# Patient Record
Sex: Male | Born: 1960
Health system: Southern US, Community
[De-identification: ages and names within clinical notes are randomized; demographics above are authoritative.]

## PROBLEM LIST (undated history)

## (undated) DIAGNOSIS — E119 Type 2 diabetes mellitus without complications: Secondary | ICD-10-CM

## (undated) DIAGNOSIS — Z955 Presence of coronary angioplasty implant and graft: Secondary | ICD-10-CM

## (undated) DIAGNOSIS — I10 Essential (primary) hypertension: Secondary | ICD-10-CM

## (undated) DIAGNOSIS — F429 Obsessive-compulsive disorder, unspecified: Secondary | ICD-10-CM

## (undated) DIAGNOSIS — I25110 Atherosclerotic heart disease of native coronary artery with unstable angina pectoris: Secondary | ICD-10-CM

## (undated) DIAGNOSIS — I2510 Atherosclerotic heart disease of native coronary artery without angina pectoris: Secondary | ICD-10-CM

## (undated) DIAGNOSIS — E785 Hyperlipidemia, unspecified: Secondary | ICD-10-CM

## (undated) DIAGNOSIS — R7303 Prediabetes: Secondary | ICD-10-CM

## (undated) DIAGNOSIS — T7422XA Child sexual abuse, confirmed, initial encounter: Secondary | ICD-10-CM

## (undated) DIAGNOSIS — I2121 ST elevation (STEMI) myocardial infarction involving left circumflex coronary artery: Secondary | ICD-10-CM

## (undated) DIAGNOSIS — N289 Disorder of kidney and ureter, unspecified: Secondary | ICD-10-CM

## (undated) HISTORY — DX: Obsessive-compulsive disorder, unspecified: F42.9

## (undated) HISTORY — DX: Child sexual abuse, confirmed, initial encounter: T74.22XA

## (undated) HISTORY — DX: Hyperlipidemia, unspecified: E78.5

## (undated) HISTORY — PX: PANCREAS SURGERY: SHX731

---

## 1999-01-09 ENCOUNTER — Encounter: Payer: Self-pay | Admitting: Emergency Medicine

## 1999-01-09 ENCOUNTER — Emergency Department (HOSPITAL_COMMUNITY): Admission: EM | Admit: 1999-01-09 | Discharge: 1999-01-09 | Payer: Self-pay | Admitting: Emergency Medicine

## 1999-01-15 ENCOUNTER — Ambulatory Visit (HOSPITAL_COMMUNITY): Admission: RE | Admit: 1999-01-15 | Discharge: 1999-01-15 | Payer: Self-pay | Admitting: Pediatrics

## 1999-01-15 ENCOUNTER — Encounter: Payer: Self-pay | Admitting: *Deleted

## 1999-01-20 ENCOUNTER — Encounter: Payer: Self-pay | Admitting: *Deleted

## 1999-01-20 ENCOUNTER — Ambulatory Visit (HOSPITAL_COMMUNITY): Admission: RE | Admit: 1999-01-20 | Discharge: 1999-01-20 | Payer: Self-pay | Admitting: Pediatrics

## 1999-05-31 ENCOUNTER — Encounter: Payer: Self-pay | Admitting: Emergency Medicine

## 1999-05-31 ENCOUNTER — Emergency Department (HOSPITAL_COMMUNITY): Admission: EM | Admit: 1999-05-31 | Discharge: 1999-06-01 | Payer: Self-pay | Admitting: Emergency Medicine

## 1999-06-01 ENCOUNTER — Encounter: Payer: Self-pay | Admitting: Emergency Medicine

## 2001-08-24 ENCOUNTER — Emergency Department (HOSPITAL_COMMUNITY): Admission: EM | Admit: 2001-08-24 | Discharge: 2001-08-25 | Payer: Self-pay | Admitting: Emergency Medicine

## 2005-03-17 ENCOUNTER — Ambulatory Visit (HOSPITAL_COMMUNITY): Admission: RE | Admit: 2005-03-17 | Discharge: 2005-03-17 | Payer: Self-pay | Admitting: Family Medicine

## 2007-05-05 ENCOUNTER — Ambulatory Visit (HOSPITAL_COMMUNITY): Admission: RE | Admit: 2007-05-05 | Discharge: 2007-05-05 | Payer: Self-pay | Admitting: Family Medicine

## 2007-06-08 ENCOUNTER — Ambulatory Visit (HOSPITAL_COMMUNITY): Admission: RE | Admit: 2007-06-08 | Discharge: 2007-06-08 | Payer: Self-pay | Admitting: Family Medicine

## 2009-01-20 ENCOUNTER — Emergency Department (HOSPITAL_COMMUNITY): Admission: EM | Admit: 2009-01-20 | Discharge: 2009-01-20 | Payer: Self-pay | Admitting: Emergency Medicine

## 2009-01-21 ENCOUNTER — Ambulatory Visit (HOSPITAL_COMMUNITY): Admission: RE | Admit: 2009-01-21 | Discharge: 2009-01-21 | Payer: Self-pay | Admitting: Emergency Medicine

## 2010-04-27 LAB — BASIC METABOLIC PANEL
BUN: 13 mg/dL (ref 6–23)
Calcium: 9.7 mg/dL (ref 8.4–10.5)
GFR calc non Af Amer: >60 mL/min (ref >60)
Glucose, Bld: 189 mg/dL — ABNORMAL HIGH (ref 70–99)

## 2012-04-27 ENCOUNTER — Encounter (HOSPITAL_COMMUNITY): Payer: Self-pay | Admitting: Psychiatry

## 2012-04-27 ENCOUNTER — Ambulatory Visit (INDEPENDENT_AMBULATORY_CARE_PROVIDER_SITE_OTHER): Payer: PRIVATE HEALTH INSURANCE | Admitting: Psychiatry

## 2012-04-27 VITALS — Wt 234.8 lb

## 2012-04-27 DIAGNOSIS — F489 Nonpsychotic mental disorder, unspecified: Secondary | ICD-10-CM

## 2012-04-27 DIAGNOSIS — F332 Major depressive disorder, recurrent severe without psychotic features: Secondary | ICD-10-CM | POA: Insufficient documentation

## 2012-04-27 DIAGNOSIS — F418 Other specified anxiety disorders: Secondary | ICD-10-CM

## 2012-04-27 DIAGNOSIS — F429 Obsessive-compulsive disorder, unspecified: Secondary | ICD-10-CM | POA: Insufficient documentation

## 2012-04-27 MED ORDER — CITALOPRAM HYDROBROMIDE 20 MG PO TABS: 20.0000 mg | ORAL_TABLET | Freq: Every day | ORAL | Status: DC

## 2012-04-27 MED ORDER — CLONIDINE HCL 0.1 MG PO TABS: ORAL_TABLET | ORAL | Status: DC
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
## 2012-04-27 NOTE — Patient Instructions (Signed)
Relaxation is the ultimate solution for you.  You can seek it through tub baths, bubble baths, essential oils or incense, walking or chatting with friends, listening to soft music, watching a candle burn and just letting all thoughts go and appreciating the true essence of the Creator.  Pets or animals may be very helpful.  You might spend some time with them and then go do more directed meditation.  Yoga is a very helpful exercise method.  On TV, on line, or by DVD Austin Mcknight is a source of high quality information about yoga and videos on yoga.  Austin Mcknight is the world's number one video yoga instructor according to some experts.  There are exceptional health benefits that can be achieved through yoga.  The main principles of yoga is acceptance, no competition, no comparison, and no judgement.  It is exceptional in helping people meditate and get to a very relaxed state.   Get caught up with sleep in the weekends  If the Clonidine doesn't last long enough, then call me back to change to some thing that lasts longer.  Call if problems or concerns.

## 2012-04-28 ENCOUNTER — Encounter (HOSPITAL_COMMUNITY): Payer: Self-pay | Admitting: Psychiatry

## 2012-05-26 ENCOUNTER — Ambulatory Visit (HOSPITAL_COMMUNITY): Payer: Self-pay | Admitting: Psychiatry

## 2012-07-14 ENCOUNTER — Ambulatory Visit (INDEPENDENT_AMBULATORY_CARE_PROVIDER_SITE_OTHER): Payer: PRIVATE HEALTH INSURANCE | Admitting: Psychiatry

## 2012-07-14 ENCOUNTER — Encounter (HOSPITAL_COMMUNITY): Payer: Self-pay | Admitting: Psychiatry

## 2012-07-14 ENCOUNTER — Telehealth (HOSPITAL_COMMUNITY): Payer: Self-pay | Admitting: Psychiatry

## 2012-07-14 VITALS — BP 120/84 | Ht 70.25 in | Wt 228.4 lb

## 2012-07-14 DIAGNOSIS — F5105 Insomnia due to other mental disorder: Secondary | ICD-10-CM

## 2012-07-14 DIAGNOSIS — F429 Obsessive-compulsive disorder, unspecified: Secondary | ICD-10-CM

## 2012-07-14 DIAGNOSIS — F332 Major depressive disorder, recurrent severe without psychotic features: Secondary | ICD-10-CM

## 2012-07-14 DIAGNOSIS — R6882 Decreased libido: Secondary | ICD-10-CM | POA: Insufficient documentation

## 2012-07-14 MED ORDER — CITALOPRAM HYDROBROMIDE 20 MG PO TABS: 30.0000 mg | ORAL_TABLET | Freq: Every day | ORAL | Status: DC

## 2012-07-14 MED ORDER — CLONIDINE HCL 0.1 MG PO TABS: ORAL_TABLET | ORAL | Status: DC

## 2012-07-14 MED ORDER — CYPROHEPTADINE HCL 4 MG PO TABS: 4.0000 mg | ORAL_TABLET | ORAL | Status: DC | PRN
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
## 2012-07-14 NOTE — Telephone Encounter (Signed)
Defined for pharmacy what the spice of life ment.  Resent Clonidine with shortened instructions for eScript transmission.

## 2012-07-14 NOTE — Patient Instructions (Addendum)
Set a timer for 8 or a certain number minutes and walk for that amount of time in the house or in the yard.  Mark the number of minutes on a calendar for that day.  Do that every day this week.  Then next week increase the time by 1 minutes and then mark the calendar with the number of minutes for that day.  Each week increase your exercise by one minute.  Keep a record of this so you can see the progress you are making.  Do this every day, just like eating and sleeping.  It is good for pain control, depression, and for your soul/spirit.  Bring the record in for your next visit so we can talk about your effort and how you feel with the new exercise program going and working for you.  Relaxation is the ultimate solution for you.  You can seek it through tub baths, bubble baths, essential oils or incense, walking or chatting with friends, listening to soft music, watching a candle burn and just letting all thoughts go and appreciating the true essence of the Creator.  Pets or animals may be very helpful.  You might spend some time with them and then go do more directed meditation.  "I am Wishes Fulfilled Meditation" by Marylene Buerger and Lyndal Pulley may be helpful MUSIC for getting to sleep or for meditating You can order it from on line.  You might find the Chill channel on [email or web address] and explore the artists that you like better.   Schedule talking therapy appointment today.  Call if problems or concerns.

## 2012-07-20 LAB — VITAMIN D 1,25 DIHYDROXY: Vitamin D2 1, 25 (OH)2: <8 pg/mL

## 2012-07-21 ENCOUNTER — Telehealth (HOSPITAL_COMMUNITY): Payer: Self-pay | Admitting: Psychiatry

## 2012-07-21 NOTE — Telephone Encounter (Signed)
Left message by initials indicating lab numbers and directing pt to reports this to PCP for follow-up.

## 2012-08-25 ENCOUNTER — Ambulatory Visit (HOSPITAL_COMMUNITY): Payer: Self-pay | Admitting: Psychiatry

## 2012-09-07 ENCOUNTER — Encounter (HOSPITAL_COMMUNITY): Payer: Self-pay | Admitting: Psychiatry

## 2012-09-07 ENCOUNTER — Ambulatory Visit (HOSPITAL_COMMUNITY): Payer: Self-pay | Admitting: Psychiatry

## 2012-09-07 ENCOUNTER — Ambulatory Visit (INDEPENDENT_AMBULATORY_CARE_PROVIDER_SITE_OTHER): Payer: PRIVATE HEALTH INSURANCE | Admitting: Psychiatry

## 2012-09-07 VITALS — BP 140/90 | Ht 70.0 in | Wt 237.0 lb

## 2012-09-07 DIAGNOSIS — F332 Major depressive disorder, recurrent severe without psychotic features: Secondary | ICD-10-CM

## 2012-09-07 DIAGNOSIS — F489 Nonpsychotic mental disorder, unspecified: Secondary | ICD-10-CM

## 2012-09-07 DIAGNOSIS — F4323 Adjustment disorder with mixed anxiety and depressed mood: Secondary | ICD-10-CM

## 2012-09-07 DIAGNOSIS — F429 Obsessive-compulsive disorder, unspecified: Secondary | ICD-10-CM

## 2012-09-07 MED ORDER — CLONAZEPAM 1 MG PO TABS: 1.0000 mg | ORAL_TABLET | Freq: Every evening | ORAL | Status: DC | PRN

## 2012-09-07 MED ORDER — DULOXETINE HCL 60 MG PO CPEP: 60.0000 mg | ORAL_CAPSULE | Freq: Every day | ORAL | Status: AC
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
## 2012-09-29 ENCOUNTER — Encounter (HOSPITAL_COMMUNITY): Payer: Self-pay | Admitting: Psychiatry

## 2012-09-29 ENCOUNTER — Ambulatory Visit (INDEPENDENT_AMBULATORY_CARE_PROVIDER_SITE_OTHER): Payer: PRIVATE HEALTH INSURANCE | Admitting: Psychiatry

## 2012-09-29 VITALS — Ht 70.0 in | Wt 231.0 lb

## 2012-09-29 DIAGNOSIS — F4323 Adjustment disorder with mixed anxiety and depressed mood: Secondary | ICD-10-CM
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
## 2012-11-29 ENCOUNTER — Ambulatory Visit (HOSPITAL_COMMUNITY): Payer: Self-pay | Admitting: Psychiatry

## 2013-01-05 ENCOUNTER — Ambulatory Visit (HOSPITAL_COMMUNITY): Payer: Self-pay | Admitting: Psychiatry

## 2013-04-04 ENCOUNTER — Encounter: Payer: Self-pay | Admitting: *Deleted

## 2013-04-11 ENCOUNTER — Ambulatory Visit (INDEPENDENT_AMBULATORY_CARE_PROVIDER_SITE_OTHER): Payer: BC Managed Care – PPO | Admitting: Endocrinology

## 2013-04-11 ENCOUNTER — Encounter: Payer: Self-pay | Admitting: Endocrinology

## 2013-04-11 VITALS — BP 114/82 | HR 75 | Temp 97.9°F | Ht 70.0 in | Wt 226.0 lb

## 2013-04-11 DIAGNOSIS — E039 Hypothyroidism, unspecified: Secondary | ICD-10-CM | POA: Insufficient documentation

## 2013-04-11 LAB — TSH: TSH: 5.527 u[IU]/mL — ABNORMAL HIGH (ref 0.350–4.500)

## 2013-04-11 LAB — T3, FREE: T3, Free: 3.4 pg/mL (ref 2.3–4.2)

## 2013-04-11 LAB — T4, FREE: FREE T4: 0.98 ng/dL (ref 0.80–1.80)

## 2013-04-11 NOTE — Patient Instructions (Signed)
blood tests are being requested for you today.  We'll contact you with results. Based on the results, i'll prescribe for you a thyroid hormone pill.
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
## 2014-11-25 ENCOUNTER — Ambulatory Visit: Payer: Self-pay | Admitting: Family Medicine

## 2014-12-26 ENCOUNTER — Ambulatory Visit: Payer: Self-pay | Admitting: Family Medicine

## 2015-01-23 ENCOUNTER — Ambulatory Visit: Payer: Self-pay | Admitting: Family Medicine

## 2015-02-18 ENCOUNTER — Ambulatory Visit (INDEPENDENT_AMBULATORY_CARE_PROVIDER_SITE_OTHER): Payer: No Typology Code available for payment source | Admitting: Family Medicine

## 2015-02-18 ENCOUNTER — Encounter: Payer: Self-pay | Admitting: Family Medicine

## 2015-02-18 ENCOUNTER — Other Ambulatory Visit: Payer: Self-pay | Admitting: Family Medicine

## 2015-02-18 VITALS — BP 142/94 | HR 80 | Temp 98.5°F | Resp 14 | Ht 69.0 in | Wt 246.0 lb

## 2015-02-18 DIAGNOSIS — E038 Other specified hypothyroidism: Secondary | ICD-10-CM

## 2015-02-18 DIAGNOSIS — Z7189 Other specified counseling: Secondary | ICD-10-CM | POA: Diagnosis not present

## 2015-02-18 DIAGNOSIS — Z0000 Encounter for general adult medical examination without abnormal findings: Secondary | ICD-10-CM | POA: Diagnosis not present

## 2015-02-18 DIAGNOSIS — Z7689 Persons encountering health services in other specified circumstances: Secondary | ICD-10-CM

## 2015-02-18 DIAGNOSIS — G4710 Hypersomnia, unspecified: Secondary | ICD-10-CM

## 2015-02-18 LAB — CBC WITH DIFFERENTIAL/PLATELET
BASOS PCT: 1 % (ref 0–1)
Basophils Absolute: 0.1 10*3/uL (ref 0.0–0.1)
Eosinophils Absolute: 0.5 10*3/uL (ref 0.0–0.7)
Eosinophils Relative: 6 % — ABNORMAL HIGH (ref 0–5)
HEMATOCRIT: 43.7 % (ref 39.0–52.0)
HEMOGLOBIN: 14.9 g/dL (ref 13.0–17.0)
LYMPHS PCT: 34 % (ref 12–46)
Lymphs Abs: 3 10*3/uL (ref 0.7–4.0)
MCH: 29.9 pg (ref 26.0–34.0)
MCHC: 34.1 g/dL (ref 30.0–36.0)
MCV: 87.6 fL (ref 78.0–100.0)
MONOS PCT: 7 % (ref 3–12)
MPV: 8.9 fL (ref 8.6–12.4)
Monocytes Absolute: 0.6 10*3/uL (ref 0.1–1.0)
NEUTROS ABS: 4.6 10*3/uL (ref 1.7–7.7)
Neutrophils Relative %: 52 % (ref 43–77)
Platelets: 343 10*3/uL (ref 150–400)
RBC: 4.99 MIL/uL (ref 4.22–5.81)
RDW: 13.7 % (ref 11.5–15.5)
WBC: 8.8 10*3/uL (ref 4.0–10.5)

## 2015-02-18 LAB — COMPLETE METABOLIC PANEL WITH GFR
ALBUMIN: 4.3 g/dL (ref 3.6–5.1)
ALK PHOS: 79 U/L (ref 40–115)
ALT: 22 U/L (ref 9–46)
AST: 19 U/L (ref 10–35)
BILIRUBIN TOTAL: 0.4 mg/dL (ref 0.2–1.2)
BUN: 14 mg/dL (ref 7–25)
CALCIUM: 9.3 mg/dL (ref 8.6–10.3)
CO2: 26 mmol/L (ref 20–31)
CREATININE: 1.1 mg/dL (ref 0.70–1.33)
Chloride: 100 mmol/L (ref 98–110)
GFR, Est African American: 89 mL/min (ref >=60)
GFR, Est Non African American: 77 mL/min (ref >=60)
GLUCOSE: 108 mg/dL — AB (ref 70–99)
Potassium: 4.3 mmol/L (ref 3.5–5.3)
SODIUM: 137 mmol/L (ref 135–146)
TOTAL PROTEIN: 7 g/dL (ref 6.1–8.1)

## 2015-02-18 LAB — LIPID PANEL
Cholesterol: 289 mg/dL — ABNORMAL HIGH (ref 125–200)
HDL: 28 mg/dL — ABNORMAL LOW (ref >=40)
Total CHOL/HDL Ratio: 10.3 Ratio — ABNORMAL HIGH (ref <=5.0)
Triglycerides: 605 mg/dL — ABNORMAL HIGH (ref <150)

## 2015-02-18 LAB — TSH: TSH: 11.884 u[IU]/mL — AB (ref 0.350–4.500)
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
## 2015-02-19 LAB — PSA: PSA: 0.7 ng/mL (ref <=4.00)

## 2015-02-21 LAB — T4, FREE: FREE T4: 0.91 ng/dL (ref 0.80–1.80)

## 2015-03-18 ENCOUNTER — Telehealth: Payer: Self-pay | Admitting: Family Medicine

## 2015-03-18 NOTE — Telephone Encounter (Signed)
Pt would like to be put on blood pressure and cholesterol medications as previously discussed with you. He saw a nurse today at work and his BP reading was 154/95. Pt also requests that a copy of his most recent labs be mailed to his home. [facility] Pt's number [phone]

## 2015-03-19 NOTE — Telephone Encounter (Signed)
I see the cholesterol med but do you want him to start on BP now of monitor as stated in your LOV note?

## 2015-03-20 ENCOUNTER — Encounter: Payer: Self-pay | Admitting: Family Medicine

## 2015-03-20 MED ORDER — LOSARTAN POTASSIUM 50 MG PO TABS: 50.0000 mg | ORAL_TABLET | Freq: Every day | ORAL | Status: DC

## 2015-03-20 MED ORDER — FENOFIBRATE 160 MG PO TABS: 160.0000 mg | ORAL_TABLET | Freq: Every day | ORAL | Status: DC

## 2015-03-20 NOTE — Telephone Encounter (Signed)
Medication called/sent to requested pharmacy and labs mailed

## 2015-03-20 NOTE — Telephone Encounter (Signed)
I would start losartan 50 mg poqdaya nd recheck bp in 1 month.

## 2015-03-27 ENCOUNTER — Telehealth: Payer: Self-pay | Admitting: *Deleted

## 2015-03-27 ENCOUNTER — Other Ambulatory Visit (HOSPITAL_COMMUNITY): Payer: Self-pay | Admitting: Respiratory Therapy

## 2015-03-27 DIAGNOSIS — G4710 Hypersomnia, unspecified: Secondary | ICD-10-CM

## 2015-03-27 NOTE — Telephone Encounter (Signed)
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

## 2015-03-27 NOTE — Telephone Encounter (Signed)
LMTRC to pt for appt information

## 2015-03-28 NOTE — Telephone Encounter (Signed)
lmtrc to pt for appt

## 2015-04-01 ENCOUNTER — Encounter: Payer: Self-pay | Admitting: *Deleted

## 2015-04-01 NOTE — Telephone Encounter (Signed)
Lmtrc, sending letter with appt information

## 2015-04-30 ENCOUNTER — Encounter: Payer: Self-pay | Admitting: Family Medicine

## 2015-04-30 ENCOUNTER — Ambulatory Visit (INDEPENDENT_AMBULATORY_CARE_PROVIDER_SITE_OTHER): Payer: No Typology Code available for payment source | Admitting: Family Medicine

## 2015-04-30 VITALS — BP 132/76 | HR 66 | Temp 98.9°F | Resp 18 | Ht 69.0 in | Wt 241.0 lb

## 2015-04-30 DIAGNOSIS — J209 Acute bronchitis, unspecified: Secondary | ICD-10-CM | POA: Diagnosis not present

## 2015-04-30 LAB — INFLUENZA A AND B AG, IMMUNOASSAY
Influenza A Antigen: NOT DETECTED
Influenza B Antigen: NOT DETECTED

## 2015-04-30 MED ORDER — AZITHROMYCIN 250 MG PO TABS: ORAL_TABLET | ORAL | Status: DC

## 2015-04-30 MED ORDER — HYDROCOD POLST-CPM POLST ER 10-8 MG/5ML PO SUER: 5.0000 mL | Freq: Two times a day (BID) | ORAL | Status: DC | PRN

## 2015-04-30 NOTE — Progress Notes (Signed)
Patient ID: Austin LovelessCharles J Mcknight, male   DOB: 1960-11-07, 55 y.o.   MRN: [id number]    Subjective:    Patient ID: Austin LovelessCharles J Mcknight, male    DOB: 1960-11-07, 55 y.o.   MRN: [id number]  Patient presents for Illness  Pt here with cough with congestion, aching from coughing for past 2 weeks. Non smoker, no fever. NO GI symptoms, little nasal drainage. Cough worse at night. Positive sick contact with daughter. Taking alka seltzer and robitussin DM     Review Of Systems:  GEN- denies fatigue, fever, weight loss,weakness, recent illness HEENT- denies eye drainage, change in vision, nasal discharge, CVS- denies chest pain, palpitations RESP- denies SOB,+ cough, wheeze ABD- denies N/V, change in stools, abd pain GU- denies dysuria, hematuria, dribbling, incontinence MSK- denies joint pain, +muscle aches, injury Neuro- denies headache, dizziness, syncope, seizure activity       Objective:    BP 132/76 mmHg  Pulse 66  Temp(Src) 98.9 F (37.2 C) (Oral)  Resp 18  Ht 5\' 9"  (1.753 m)  Wt 241 lb (109.317 kg)  BMI 35.57 kg/m2  SpO2 96% GEN- NAD, alert and oriented x3 HEENT- PERRL, EOMI, non injected sclera, pink conjunctiva, MMM, oropharynx clear, nares clear rhinorrhea, no maxillary tenderness  Neck- Supple, no LAD  CVS- RRR, no murmur RESP-CTAB,harsh cough, normal WOB  Pulses- Radial - 2+        Assessment & Plan:      Problem List Items Addressed This Visit    None    Visit Diagnoses    Acute bronchitis, unspecified organism    -  Primary    Flu negative. Treat with tussionex, zpak, contiue allergy medications.     Relevant Orders    Influenza A and B Ag, Immunoassay       Note: This dictation was prepared with Dragon dictation along with smaller phrase technology. Any transcriptional errors that result from this process are unintentional.

## 2015-04-30 NOTE — Patient Instructions (Signed)
Take antibiotics Cough medicine given F/U as needed Give work note for today

## 2015-06-20 ENCOUNTER — Encounter: Payer: Self-pay | Admitting: Family Medicine

## 2015-06-20 ENCOUNTER — Ambulatory Visit (INDEPENDENT_AMBULATORY_CARE_PROVIDER_SITE_OTHER): Payer: No Typology Code available for payment source | Admitting: Family Medicine

## 2015-06-20 VITALS — BP 110/80 | HR 78 | Temp 98.1°F | Resp 16 | Ht 69.0 in | Wt 239.0 lb

## 2015-06-20 DIAGNOSIS — E781 Pure hyperglyceridemia: Secondary | ICD-10-CM | POA: Diagnosis not present

## 2015-06-20 DIAGNOSIS — I10 Essential (primary) hypertension: Secondary | ICD-10-CM

## 2015-06-20 DIAGNOSIS — E785 Hyperlipidemia, unspecified: Secondary | ICD-10-CM

## 2015-06-20 DIAGNOSIS — R7303 Prediabetes: Secondary | ICD-10-CM

## 2015-06-20 DIAGNOSIS — E038 Other specified hypothyroidism: Secondary | ICD-10-CM | POA: Diagnosis not present
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
## 2015-10-16 ENCOUNTER — Other Ambulatory Visit: Payer: Self-pay | Admitting: Family Medicine

## 2015-11-07 ENCOUNTER — Encounter: Payer: Self-pay | Admitting: Family Medicine

## 2015-11-21 ENCOUNTER — Ambulatory Visit: Payer: No Typology Code available for payment source | Admitting: Family Medicine

## 2016-05-03 ENCOUNTER — Encounter: Payer: Self-pay | Admitting: Family Medicine

## 2016-05-31 ENCOUNTER — Encounter: Payer: Self-pay | Admitting: Family Medicine

## 2016-08-10 ENCOUNTER — Encounter: Payer: Self-pay | Admitting: Family Medicine

## 2017-02-10 ENCOUNTER — Inpatient Hospital Stay (HOSPITAL_COMMUNITY)
Admission: EM | Disposition: A | Discharge status: Home / Self Care | Payer: Self-pay | Source: Home / Self Care | Attending: Cardiovascular Disease

## 2017-02-10 ENCOUNTER — Other Ambulatory Visit: Payer: Self-pay

## 2017-02-10 ENCOUNTER — Encounter (HOSPITAL_COMMUNITY): Payer: Self-pay | Admitting: Cardiology

## 2017-02-10 ENCOUNTER — Inpatient Hospital Stay (HOSPITAL_COMMUNITY): Payer: PRIVATE HEALTH INSURANCE

## 2017-02-10 ENCOUNTER — Inpatient Hospital Stay (HOSPITAL_COMMUNITY)
Admission: EM | Admit: 2017-02-10 | Discharge: 2017-02-12 | DRG: 247 | Disposition: A | Discharge status: Home / Self Care | Payer: PRIVATE HEALTH INSURANCE | Attending: Cardiovascular Disease | Admitting: Cardiovascular Disease

## 2017-02-10 DIAGNOSIS — Z8249 Family history of ischemic heart disease and other diseases of the circulatory system: Secondary | ICD-10-CM | POA: Diagnosis not present

## 2017-02-10 DIAGNOSIS — F429 Obsessive-compulsive disorder, unspecified: Secondary | ICD-10-CM | POA: Diagnosis present

## 2017-02-10 DIAGNOSIS — Z87891 Personal history of nicotine dependence: Secondary | ICD-10-CM | POA: Diagnosis not present

## 2017-02-10 DIAGNOSIS — I10 Essential (primary) hypertension: Secondary | ICD-10-CM | POA: Diagnosis not present

## 2017-02-10 DIAGNOSIS — R739 Hyperglycemia, unspecified: Secondary | ICD-10-CM | POA: Diagnosis present

## 2017-02-10 DIAGNOSIS — I25110 Atherosclerotic heart disease of native coronary artery with unstable angina pectoris: Secondary | ICD-10-CM

## 2017-02-10 DIAGNOSIS — E785 Hyperlipidemia, unspecified: Secondary | ICD-10-CM | POA: Diagnosis present

## 2017-02-10 DIAGNOSIS — R7303 Prediabetes: Secondary | ICD-10-CM | POA: Diagnosis present

## 2017-02-10 DIAGNOSIS — I213 ST elevation (STEMI) myocardial infarction of unspecified site: Secondary | ICD-10-CM

## 2017-02-10 DIAGNOSIS — I2119 ST elevation (STEMI) myocardial infarction involving other coronary artery of inferior wall: Secondary | ICD-10-CM | POA: Diagnosis not present

## 2017-02-10 DIAGNOSIS — Z7982 Long term (current) use of aspirin: Secondary | ICD-10-CM

## 2017-02-10 DIAGNOSIS — Z833 Family history of diabetes mellitus: Secondary | ICD-10-CM

## 2017-02-10 DIAGNOSIS — I2510 Atherosclerotic heart disease of native coronary artery without angina pectoris: Secondary | ICD-10-CM | POA: Diagnosis present

## 2017-02-10 DIAGNOSIS — I2121 ST elevation (STEMI) myocardial infarction involving left circumflex coronary artery: Principal | ICD-10-CM | POA: Insufficient documentation

## 2017-02-10 DIAGNOSIS — Z955 Presence of coronary angioplasty implant and graft: Secondary | ICD-10-CM | POA: Diagnosis not present

## 2017-02-10 HISTORY — DX: Atherosclerotic heart disease of native coronary artery without angina pectoris: I25.10

## 2017-02-10 HISTORY — DX: ST elevation (STEMI) myocardial infarction involving left circumflex coronary artery: I21.21

## 2017-02-10 HISTORY — DX: Presence of coronary angioplasty implant and graft: Z95.5

## 2017-02-10 HISTORY — DX: Atherosclerotic heart disease of native coronary artery with unstable angina pectoris: I25.110

## 2017-02-10 HISTORY — PX: CORONARY/GRAFT ACUTE MI REVASCULARIZATION: CATH118305

## 2017-02-10 HISTORY — PX: LEFT HEART CATH AND CORONARY ANGIOGRAPHY: CATH118249

## 2017-02-10 HISTORY — DX: Prediabetes: R73.03

## 2017-02-10 LAB — CBC WITH DIFFERENTIAL/PLATELET
BASOS ABS: 0.1 10*3/uL (ref 0.0–0.1)
BASOS PCT: 1 %
EOS ABS: 0.5 10*3/uL (ref 0.0–0.7)
EOS PCT: 5 %
HCT: 42.9 % (ref 39.0–52.0)
Hemoglobin: 14.9 g/dL (ref 13.0–17.0)
LYMPHS PCT: 33 %
Lymphs Abs: 3.6 10*3/uL (ref 0.7–4.0)
MCH: 29.9 pg (ref 26.0–34.0)
MCHC: 34.7 g/dL (ref 30.0–36.0)
MCV: 86 fL (ref 78.0–100.0)
Monocytes Absolute: 0.8 10*3/uL (ref 0.1–1.0)
Monocytes Relative: 7 %
Neutro Abs: 5.9 10*3/uL (ref 1.7–7.7)
Neutrophils Relative %: 54 %
PLATELETS: 328 10*3/uL (ref 150–400)
RBC: 4.99 MIL/uL (ref 4.22–5.81)
RDW: 12.3 % (ref 11.5–15.5)
WBC: 10.9 10*3/uL — AB (ref 4.0–10.5)

## 2017-02-10 LAB — TROPONIN I
Troponin I: 17.56 ng/mL (ref <0.03)
Troponin I: 27.78 ng/mL (ref <0.03)

## 2017-02-10 LAB — COMPREHENSIVE METABOLIC PANEL
ALT: 26 U/L (ref 17–63)
ANION GAP: 12 (ref 5–15)
AST: 24 U/L (ref 15–41)
Albumin: 4.1 g/dL (ref 3.5–5.0)
Alkaline Phosphatase: 65 U/L (ref 38–126)
BUN: 11 mg/dL (ref 6–20)
CHLORIDE: 107 mmol/L (ref 101–111)
CO2: 18 mmol/L — AB (ref 22–32)
Calcium: 9.4 mg/dL (ref 8.9–10.3)
Creatinine, Ser: 1.12 mg/dL (ref 0.61–1.24)
GFR calc non Af Amer: >60 mL/min (ref >60)
Glucose, Bld: 177 mg/dL — ABNORMAL HIGH (ref 65–99)
Potassium: 4 mmol/L (ref 3.5–5.1)
SODIUM: 137 mmol/L (ref 135–145)
Total Bilirubin: 0.9 mg/dL (ref 0.3–1.2)
Total Protein: 6.7 g/dL (ref 6.5–8.1)

## 2017-02-10 LAB — POCT I-STAT, CHEM 8
BUN: 14 mg/dL (ref 6–20)
CREATININE: 1 mg/dL (ref 0.61–1.24)
Calcium, Ion: 1.13 mmol/L — ABNORMAL LOW (ref 1.15–1.40)
Chloride: 105 mmol/L (ref 101–111)
GLUCOSE: 183 mg/dL — AB (ref 65–99)
HCT: 44 % (ref 39.0–52.0)
HEMOGLOBIN: 15 g/dL (ref 13.0–17.0)
Potassium: 4.1 mmol/L (ref 3.5–5.1)
Sodium: 138 mmol/L (ref 135–145)
TCO2: 19 mmol/L — AB (ref 22–32)

## 2017-02-10 LAB — PROTIME-INR
INR: 1.12
PROTHROMBIN TIME: 14.4 s (ref 11.4–15.2)

## 2017-02-10 LAB — MRSA PCR SCREENING: MRSA by PCR: NEGATIVE

## 2017-02-10 LAB — POCT I-STAT TROPONIN I: Troponin i, poc: 0 ng/mL (ref 0.00–0.08)

## 2017-02-10 LAB — LIPID PANEL
CHOL/HDL RATIO: 9.9 ratio
CHOLESTEROL: 267 mg/dL — AB (ref 0–200)
HDL: 27 mg/dL — ABNORMAL LOW (ref >40)
LDL Cholesterol: 168 mg/dL — ABNORMAL HIGH (ref 0–99)
TRIGLYCERIDES: 362 mg/dL — AB (ref <150)
VLDL: 72 mg/dL — AB (ref 0–40)

## 2017-02-10 LAB — POCT ACTIVATED CLOTTING TIME: Activated Clotting Time: 450 seconds

## 2017-02-10 LAB — APTT: APTT: 25 s (ref 24–36)

## 2017-02-10 SURGERY — LEFT HEART CATH AND CORONARY ANGIOGRAPHY
Anesthesia: LOCAL

## 2017-02-10 MED ORDER — HEPARIN SODIUM (PORCINE) 5000 UNIT/ML IJ SOLN
4000.0000 [IU] | Freq: Once | INTRAMUSCULAR | Status: AC
  Administered 2017-02-10: 4000 [IU] via INTRAVENOUS

## 2017-02-10 MED ORDER — LORATADINE 10 MG PO TABS
10.0000 mg | ORAL_TABLET | Freq: Every day | ORAL | Status: DC
  Administered 2017-02-11 – 2017-02-12 (×2): 10 mg via ORAL
  Filled 2017-02-10 (×2): qty 1

## 2017-02-10 MED ORDER — LORATADINE 10 MG PO TBDP: 10.0000 mg | ORAL_TABLET | Freq: Every day | ORAL | Status: DC

## 2017-02-10 MED ORDER — ATORVASTATIN CALCIUM 80 MG PO TABS
80.0000 mg | ORAL_TABLET | Freq: Every day | ORAL | Status: DC
  Administered 2017-02-11: 80 mg via ORAL
  Filled 2017-02-10: qty 1

## 2017-02-10 MED ORDER — IOPAMIDOL (ISOVUE-370) INJECTION 76%
INTRAVENOUS | Status: AC
  Filled 2017-02-10: qty 100

## 2017-02-10 MED ORDER — TRAZODONE HCL 50 MG PO TABS
50.0000 mg | ORAL_TABLET | Freq: Every evening | ORAL | Status: DC | PRN
  Administered 2017-02-10 – 2017-02-11 (×2): 50 mg via ORAL
  Filled 2017-02-10 (×2): qty 1

## 2017-02-10 MED ORDER — TIROFIBAN HCL IN NACL 5-0.9 MG/100ML-% IV SOLN
INTRAVENOUS | Status: AC | PRN
  Administered 2017-02-10: 0.15 ug/kg/min via INTRAVENOUS

## 2017-02-10 MED ORDER — TIROFIBAN HCL IN NACL 5-0.9 MG/100ML-% IV SOLN
INTRAVENOUS | Status: AC
  Filled 2017-02-10: qty 100

## 2017-02-10 MED ORDER — SODIUM CHLORIDE 0.9 % IV SOLN: INTRAVENOUS | Status: AC

## 2017-02-10 MED ORDER — ASPIRIN 81 MG PO CHEW
81.0000 mg | CHEWABLE_TABLET | Freq: Every day | ORAL | Status: DC
  Administered 2017-02-11 – 2017-02-12 (×2): 81 mg via ORAL
  Filled 2017-02-10 (×2): qty 1

## 2017-02-10 MED ORDER — ACETAMINOPHEN 325 MG PO TABS
650.0000 mg | ORAL_TABLET | ORAL | Status: DC | PRN
  Administered 2017-02-10: 650 mg via ORAL
  Filled 2017-02-10: qty 2

## 2017-02-10 MED ORDER — IOPAMIDOL (ISOVUE-370) INJECTION 76%
INTRAVENOUS | Status: DC | PRN
  Administered 2017-02-10: 265 mL via INTRA_ARTERIAL

## 2017-02-10 MED ORDER — NITROGLYCERIN 1 MG/10 ML FOR IR/CATH LAB
INTRA_ARTERIAL | Status: AC
  Filled 2017-02-10: qty 10

## 2017-02-10 MED ORDER — VERAPAMIL HCL 2.5 MG/ML IV SOLN
INTRAVENOUS | Status: DC | PRN
  Administered 2017-02-10: 10 mL via INTRA_ARTERIAL

## 2017-02-10 MED ORDER — SODIUM CHLORIDE 0.9% FLUSH: 3.0000 mL | INTRAVENOUS | Status: DC | PRN

## 2017-02-10 MED ORDER — TICAGRELOR 90 MG PO TABS
ORAL_TABLET | ORAL | Status: DC | PRN
  Administered 2017-02-10: 180 mg via ORAL

## 2017-02-10 MED ORDER — SODIUM CHLORIDE 0.9 % IV SOLN
INTRAVENOUS | Status: AC | PRN
  Administered 2017-02-10: 1.75 mg/kg/h via INTRAVENOUS

## 2017-02-10 MED ORDER — BIVALIRUDIN TRIFLUOROACETATE 250 MG IV SOLR
INTRAVENOUS | Status: AC
  Filled 2017-02-10: qty 250

## 2017-02-10 MED ORDER — LABETALOL HCL 5 MG/ML IV SOLN: 10.0000 mg | INTRAVENOUS | Status: AC | PRN

## 2017-02-10 MED ORDER — MORPHINE SULFATE (PF) 4 MG/ML IV SOLN
INTRAVENOUS | Status: AC
  Filled 2017-02-10: qty 1

## 2017-02-10 MED ORDER — LIDOCAINE HCL (PF) 1 % IJ SOLN
INTRAMUSCULAR | Status: AC
  Filled 2017-02-10: qty 30

## 2017-02-10 MED ORDER — BIVALIRUDIN BOLUS VIA INFUSION - CUPID
INTRAVENOUS | Status: DC | PRN
  Administered 2017-02-10: 81.675 mg via INTRAVENOUS

## 2017-02-10 MED ORDER — HYDRALAZINE HCL 20 MG/ML IJ SOLN: 5.0000 mg | Freq: Four times a day (QID) | INTRAMUSCULAR | Status: DC | PRN

## 2017-02-10 MED ORDER — METOPROLOL TARTRATE 12.5 MG HALF TABLET
25.0000 mg | ORAL_TABLET | Freq: Two times a day (BID) | ORAL | Status: DC
  Administered 2017-02-10: 25 mg via ORAL
  Filled 2017-02-10: qty 2

## 2017-02-10 MED ORDER — FENOFIBRATE 160 MG PO TABS
160.0000 mg | ORAL_TABLET | Freq: Every day | ORAL | Status: DC
  Administered 2017-02-11: 160 mg via ORAL
  Filled 2017-02-10: qty 1

## 2017-02-10 MED ORDER — TICAGRELOR 90 MG PO TABS
90.0000 mg | ORAL_TABLET | Freq: Two times a day (BID) | ORAL | Status: DC
  Administered 2017-02-10 – 2017-02-12 (×4): 90 mg via ORAL
  Filled 2017-02-10 (×4): qty 1

## 2017-02-10 MED ORDER — FENTANYL CITRATE (PF) 100 MCG/2ML IJ SOLN
INTRAMUSCULAR | Status: DC | PRN
  Administered 2017-02-10 (×2): 25 ug via INTRAVENOUS

## 2017-02-10 MED ORDER — IOPAMIDOL (ISOVUE-370) INJECTION 76%
INTRAVENOUS | Status: AC
  Filled 2017-02-10: qty 125

## 2017-02-10 MED ORDER — SODIUM CHLORIDE 0.9% FLUSH
3.0000 mL | Freq: Two times a day (BID) | INTRAVENOUS | Status: DC
  Administered 2017-02-11 – 2017-02-12 (×2): 3 mL via INTRAVENOUS

## 2017-02-10 MED ORDER — ONDANSETRON HCL 4 MG/2ML IJ SOLN: 4.0000 mg | Freq: Four times a day (QID) | INTRAMUSCULAR | Status: DC | PRN

## 2017-02-10 MED ORDER — TIROFIBAN HCL IN NACL 5-0.9 MG/100ML-% IV SOLN
0.1500 ug/kg/min | INTRAVENOUS | Status: AC
  Administered 2017-02-10: 0.15 ug/kg/min via INTRAVENOUS

## 2017-02-10 MED ORDER — ONDANSETRON HCL 4 MG/2ML IJ SOLN: 4.0000 mg | Freq: Once | INTRAMUSCULAR | Status: DC

## 2017-02-10 MED ORDER — LIDOCAINE HCL (PF) 1 % IJ SOLN
INTRAMUSCULAR | Status: DC | PRN
  Administered 2017-02-10: 5 mL

## 2017-02-10 MED ORDER — MIDAZOLAM HCL 2 MG/2ML IJ SOLN
INTRAMUSCULAR | Status: AC
  Filled 2017-02-10: qty 2

## 2017-02-10 MED ORDER — SODIUM CHLORIDE 0.9 % IV SOLN
250.0000 mL | INTRAVENOUS | Status: DC | PRN
  Administered 2017-02-10: 250 mL via INTRAVENOUS

## 2017-02-10 MED ORDER — TICAGRELOR 90 MG PO TABS
ORAL_TABLET | ORAL | Status: AC
  Filled 2017-02-10: qty 2

## 2017-02-10 MED ORDER — HYDRALAZINE HCL 20 MG/ML IJ SOLN: 10.0000 mg | Freq: Once | INTRAMUSCULAR | Status: DC

## 2017-02-10 MED ORDER — FENTANYL CITRATE (PF) 100 MCG/2ML IJ SOLN
INTRAMUSCULAR | Status: AC
  Filled 2017-02-10: qty 2

## 2017-02-10 MED ORDER — METOPROLOL TARTRATE 50 MG PO TABS
50.0000 mg | ORAL_TABLET | Freq: Two times a day (BID) | ORAL | Status: DC
  Administered 2017-02-10 – 2017-02-12 (×4): 50 mg via ORAL
  Filled 2017-02-10 (×4): qty 1

## 2017-02-10 MED ORDER — TIROFIBAN (AGGRASTAT) BOLUS VIA INFUSION
INTRAVENOUS | Status: DC | PRN
  Administered 2017-02-10: 2722.5 ug via INTRAVENOUS

## 2017-02-10 MED ORDER — MIDAZOLAM HCL 2 MG/2ML IJ SOLN
INTRAMUSCULAR | Status: DC | PRN
  Administered 2017-02-10 (×2): 1 mg via INTRAVENOUS

## 2017-02-10 MED ORDER — HEPARIN (PORCINE) IN NACL 2-0.9 UNIT/ML-% IJ SOLN
INTRAMUSCULAR | Status: AC
  Filled 2017-02-10: qty 1500

## 2017-02-10 MED ORDER — LOSARTAN POTASSIUM 50 MG PO TABS
50.0000 mg | ORAL_TABLET | Freq: Every day | ORAL | Status: DC
  Administered 2017-02-10 – 2017-02-12 (×3): 50 mg via ORAL
  Filled 2017-02-10 (×3): qty 1

## 2017-02-10 MED ORDER — SODIUM CHLORIDE 0.9 % IV SOLN
INTRAVENOUS | Status: DC | PRN
  Administered 2017-02-10: 100 mL/h via INTRAVENOUS
  Administered 2017-02-10: 20 mL/h via INTRAVENOUS

## 2017-02-10 MED ORDER — ONDANSETRON HCL 4 MG/2ML IJ SOLN
INTRAMUSCULAR | Status: AC
  Filled 2017-02-10: qty 2

## 2017-02-10 MED ORDER — VERAPAMIL HCL 2.5 MG/ML IV SOLN
INTRAVENOUS | Status: AC
  Filled 2017-02-10: qty 2

## 2017-02-10 MED ORDER — HEPARIN SODIUM (PORCINE) 5000 UNIT/ML IJ SOLN
INTRAMUSCULAR | Status: AC
  Filled 2017-02-10: qty 1

## 2017-02-10 MED ORDER — MORPHINE SULFATE (PF) 4 MG/ML IV SOLN
4.0000 mg | Freq: Once | INTRAVENOUS | Status: AC
  Administered 2017-02-10: 4 mg via INTRAVENOUS

## 2017-02-10 MED ORDER — HYDRALAZINE HCL 20 MG/ML IJ SOLN
5.0000 mg | INTRAMUSCULAR | Status: AC | PRN
  Administered 2017-02-10: 5 mg via INTRAVENOUS
  Filled 2017-02-10: qty 1

## 2017-02-10 MED ORDER — HEPARIN SODIUM (PORCINE) 1000 UNIT/ML IJ SOLN
INTRAMUSCULAR | Status: AC
  Filled 2017-02-10: qty 1

## 2017-02-10 SURGICAL SUPPLY — 21 items
BALLN SAPPHIRE 2.5X12 (BALLOONS) ×2
BALLN SAPPHIRE ~~LOC~~ 3.75X15 (BALLOONS) ×2 IMPLANT
BALLOON SAPPHIRE 2.5X12 (BALLOONS) ×1 IMPLANT
CATH 5FR JL3.5 JR4 ANG PIG MP (CATHETERS) ×2 IMPLANT
CATH LAUNCHER 5F RADR (CATHETERS) ×1 IMPLANT
CATH VISTA GUIDE 6FR AL1 (CATHETERS) ×2 IMPLANT
CATH VISTA GUIDE 6FR XB3 (CATHETERS) ×2 IMPLANT
CATHETER LAUNCHER 5F RADR (CATHETERS) ×2
DEVICE RAD COMP TR BAND LRG (VASCULAR PRODUCTS) ×2 IMPLANT
ELECT DEFIB PAD ADLT CADENCE (PAD) ×2 IMPLANT
GLIDESHEATH SLEND SS 6F .021 (SHEATH) ×2 IMPLANT
GUIDEWIRE INQWIRE 1.5J.035X260 (WIRE) ×1 IMPLANT
INQWIRE 1.5J .035X260CM (WIRE) ×2
KIT ENCORE 26 ADVANTAGE (KITS) ×2 IMPLANT
KIT HEART LEFT (KITS) ×2 IMPLANT
PACK CARDIAC CATHETERIZATION (CUSTOM PROCEDURE TRAY) ×2 IMPLANT
STENT PROMUS PREM MR 3.5X24 (Permanent Stent) ×2 IMPLANT
SYR MEDRAD MARK V 150ML (SYRINGE) ×2 IMPLANT
TRANSDUCER W/STOPCOCK (MISCELLANEOUS) ×2 IMPLANT
TUBING CIL FLEX 10 FLL-RA (TUBING) ×2 IMPLANT
WIRE COUGAR XT STRL 190CM (WIRE) ×2 IMPLANT

## 2017-02-10 NOTE — ED Triage Notes (Signed)
[facility], c/o chest pain started at 5 am,  Mid chest radiating to left arm, 10/10--  Received NTG x 4, ASA 324mg , zofran 4mg ,
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
## 2017-02-10 NOTE — Progress Notes (Signed)
CRITICAL VALUE ALERT  Critical Value:  Troponin 27.78  Date & Time Notifed: 02/11/16 23:45    Provider Notified: Vonzella NippleWosik    Orders Received/Actions taken: No

## 2017-02-10 NOTE — Progress Notes (Signed)
Received notification of troponin of 17. Patient admitted with STEMI s/p PCI today, follow troponin trend at this time, findings consistent with his infarct   Dina RichJonathan Hillel Card MD
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
## 2017-02-11 ENCOUNTER — Other Ambulatory Visit: Payer: Self-pay

## 2017-02-11 ENCOUNTER — Encounter (HOSPITAL_COMMUNITY): Payer: Self-pay | Admitting: Cardiovascular Disease

## 2017-02-11 ENCOUNTER — Inpatient Hospital Stay (HOSPITAL_COMMUNITY): Payer: PRIVATE HEALTH INSURANCE

## 2017-02-11 DIAGNOSIS — I25110 Atherosclerotic heart disease of native coronary artery with unstable angina pectoris: Secondary | ICD-10-CM

## 2017-02-11 DIAGNOSIS — I2119 ST elevation (STEMI) myocardial infarction involving other coronary artery of inferior wall: Secondary | ICD-10-CM

## 2017-02-11 DIAGNOSIS — I2121 ST elevation (STEMI) myocardial infarction involving left circumflex coronary artery: Principal | ICD-10-CM

## 2017-02-11 DIAGNOSIS — R739 Hyperglycemia, unspecified: Secondary | ICD-10-CM

## 2017-02-11 DIAGNOSIS — Z955 Presence of coronary angioplasty implant and graft: Secondary | ICD-10-CM

## 2017-02-11 DIAGNOSIS — I2510 Atherosclerotic heart disease of native coronary artery without angina pectoris: Secondary | ICD-10-CM | POA: Diagnosis present

## 2017-02-11 LAB — BASIC METABOLIC PANEL
Anion gap: 10 (ref 5–15)
BUN: 12 mg/dL (ref 6–20)
CALCIUM: 9.3 mg/dL (ref 8.9–10.3)
CO2: 21 mmol/L — ABNORMAL LOW (ref 22–32)
CREATININE: 1.08 mg/dL (ref 0.61–1.24)
Chloride: 106 mmol/L (ref 101–111)
GFR calc Af Amer: >60 mL/min (ref >60)
Glucose, Bld: 126 mg/dL — ABNORMAL HIGH (ref 65–99)
Potassium: 4 mmol/L (ref 3.5–5.1)
SODIUM: 137 mmol/L (ref 135–145)

## 2017-02-11 LAB — CBC
HCT: 39.3 % (ref 39.0–52.0)
Hemoglobin: 13.6 g/dL (ref 13.0–17.0)
MCH: 30.2 pg (ref 26.0–34.0)
MCHC: 34.6 g/dL (ref 30.0–36.0)
MCV: 87.1 fL (ref 78.0–100.0)
PLATELETS: 267 10*3/uL (ref 150–400)
RBC: 4.51 MIL/uL (ref 4.22–5.81)
RDW: 12.6 % (ref 11.5–15.5)
WBC: 13.6 10*3/uL — AB (ref 4.0–10.5)

## 2017-02-11 LAB — ECHOCARDIOGRAM COMPLETE
Height: 71 in
WEIGHTICAEL: 3851.88 [oz_av]

## 2017-02-11 LAB — TROPONIN I: TROPONIN I: 20.49 ng/mL — AB (ref <0.03)

## 2017-02-11 MED FILL — Nitroglycerin IV Soln 100 MCG/ML in D5W: INTRA_ARTERIAL | Qty: 10 | Status: AC

## 2017-02-11 MED FILL — Heparin Sodium (Porcine) 2 Unit/ML in Sodium Chloride 0.9%: INTRAMUSCULAR | Qty: 1000 | Status: AC

## 2017-02-11 NOTE — Care Management Note (Signed)
Case Management Note  Patient Details  Name: Pricilla LovelessCharles J Birkel MRN: [id number] Date of Birth: [birth year]  Subjective/Objective:   From home, pta indep,  Presents with STEMI, s/p PTCA, will be on brilinta. NCM gave patient the 30 day free coupon and the $5 co pay card.  He will need a printed script for brilinta because his regular pharmacy does not have brilinta in stock , [facility] [place] [facility] [place] have in stock.                Action/Plan: DC home tomorrow .   Expected Discharge Date:  02/12/17               Expected Discharge Plan:  Home/Self Care  In-House Referral:     Discharge planning Services  CM Consult  Post Acute Care Choice:    Choice offered to:     DME Arranged:    DME Agency:     HH Arranged:    HH Agency:     Status of Service:  Completed, signed off  If discussed at [employer] Length of Stay Meetings, dates discussed:    Additional Comments:  Leone Havenaylor, Quashawn Jewkes [place], RN 02/11/2017, 3:12 PM

## 2017-02-11 NOTE — Progress Notes (Signed)
  Echocardiogram 2D Echocardiogram has been performed.  Roosvelt MaserLane, Naftula Donahue F 02/11/2017, 1:31 PM

## 2017-02-11 NOTE — Progress Notes (Signed)
Progress Note  Patient Name: Austin Mcknight Date of Encounter: 02/11/2017  Primary Cardiologist: Verne Carrow, MD   Subjective   Feeling well. Has walked multiple laps around the nursing unit. No further chest pain.  Inpatient Medications    Scheduled Meds: . aspirin  81 mg Oral Daily  . atorvastatin  80 mg Oral q1800  . fenofibrate  160 mg Oral Daily  . loratadine  10 mg Oral Daily  . losartan  50 mg Oral Daily  . metoprolol tartrate  50 mg Oral BID  . sodium chloride flush  3 mL Intravenous Q12H  . ticagrelor  90 mg Oral BID   Continuous Infusions: . sodium chloride 250 mL (02/10/17 2058)   PRN Meds: sodium chloride, acetaminophen, hydrALAZINE, ondansetron (ZOFRAN) IV, sodium chloride flush, traZODone   Vital Signs    Vitals:   02/11/17 0700 02/11/17 0800 02/11/17 0816 02/11/17 0900  BP: 114/83 (!) 126/96  126/88  Pulse: 74 77  77  Resp: (!) 9 12  13   Temp:   98.3 F (36.8 C)   TempSrc:   Oral   SpO2: 97% 98%  98%  Weight:      Height:        Intake/Output Summary (Last 24 hours) at 02/11/2017 1136 Last data filed at 02/11/2017 0700 Gross per 24 hour  Intake 1054.35 ml  Output 1750 ml  Net -695.65 ml   Filed Weights   02/10/17 0800 02/10/17 1505  Weight: 240 lb (108.9 kg) 240 lb 11.9 oz (109.2 kg)    Telemetry    Sinus rhythm- Personally Reviewed  ECG    January 18: Normal sinus rhythm, rate 75 bpm. Normal EKG. - Personally Reviewed  Physical Exam   General appearance: alert, cooperative, appears stated age, no distress, moderately obese and Pleasant mood and affect Neck: no carotid bruit, no JVD and supple, symmetrical, trachea midline Lungs: clear to auscultation bilaterally, normal percussion bilaterally and Nonlabored. Good air movement Heart: regular rate and rhythm, S1, S2 normal, no murmur, click, rub or gallop and normal apical impulse Abdomen: soft, non-tender; bowel sounds normal; no masses,  no organomegaly Extremities:  extremities normal, atraumatic, no cyanosis or edema and Radial cath site C/D/I Pulses: 2+ and symmetric Neurologic: Alert and oriented X 3, normal strength and tone. Normal symmetric reflexes. Normal coordination and gait   Labs    Chemistry Recent Labs  Lab 02/10/17 0801 02/10/17 0814 02/11/17 0222  NA 137 138 137  K 4.0 4.1 4.0  CL 107 105 106  CO2 18*  --  21*  GLUCOSE 177* 183* 126*  BUN 11 14 12   CREATININE 1.12 1.00 1.08  CALCIUM 9.4  --  9.3  PROT 6.7  --   --   ALBUMIN 4.1  --   --   AST 24  --   --   ALT 26  --   --   ALKPHOS 65  --   --   BILITOT 0.9  --   --   GFRNONAA >60  --  >60  GFRAA >60  --  >60  ANIONGAP 12  --  10     Hematology Recent Labs  Lab 02/10/17 0801 02/10/17 0814 02/11/17 0222  WBC 10.9*  --  13.6*  RBC 4.99  --  4.51  HGB 14.9 15.0 13.6  HCT 42.9 44.0 39.3  MCV 86.0  --  87.1  MCH 29.9  --  30.2  MCHC 34.7  --  34.6  RDW  12.3  --  12.6  PLT 328  --  267    Cardiac Enzymes Recent Labs  Lab 02/10/17 0801 02/10/17 1620 02/10/17 2059 02/11/17 0224  TROPONINI <0.03 17.56* 27.78* 20.49*    Recent Labs  Lab 02/10/17 0812  TROPIPOC 0.00     BNPNo results for input(s): BNP, PROBNP in the last 168 hours.   DDimer No results for input(s): DDIMER in the last 168 hours.   Radiology    Dg Chest Portable 1 View  Result Date: 02/10/2017 CLINICAL DATA:  Code STEMI. EXAM: PORTABLE CHEST 1 VIEW COMPARISON:  03/17/2005. FINDINGS: Mediastinum and hilar structures normal. No focal infiltrate. Right costophrenic angle not completely imaged. Tiny left pleural effusion cannot be excluded. Borderline cardiomegaly. No pulmonary venous congestion. No acute bony abnormality. IMPRESSION: 1. Right costophrenic angle not imaged. Tiny left pleural effusion cannot be excluded. 2.  Borderline cardiomegaly.  No pulmonary venous congestion. Electronically Signed   By: Maisie Fushomas  Register   On: 02/10/2017 16:37    Cardiac Studies    CATH/PCI  02/11/2017  . Prox Cx to Mid Cx lesion is 100% stenosed -=- DES PCI STENT PROMUS PREM MR 3.5X24 --> 0% residual stenosis.  Prox LAD lesion is 50% stenosed.  Ost Cx to Prox Cx lesion is 30% stenosed.  Mid RCA lesion is 40% stenosed.  RPDA lesion is 30% stenosed.  Normal LV function and diastolic pressure. EF 55-60%. No MR  Echocardiogram 02/11/17: Pending  Patient Profile     57 y.o. male with a history of untreated hypertension and hyperlipidemia (he takes medications when he can afford it) along with a strong family history with father having CAD-CABG at a relatively early age. He presented morning of 02/10/2017 the EMS after acute onset substernal chest pain radiating to both arms. In route, his EKG showed signs of subtle inferior ST elevations. Code STEMI was called upon arrival to ER and was taken directly to the cardiac catheterization lab. Found to have occluded circumflex treated with DES stent.   Assessment & Plan    Principal Problem:   Acute ST elevation myocardial infarction (STEMI) involving left circumflex coronary artery without development of Q waves (HCC) Active Problems:   Coronary artery disease involving native coronary artery of native heart with unstable angina pectoris (HCC)   Presence of drug coated stent in left circumflex coronary artery   Essential hypertension   Hyperlipidemia with target low density lipoprotein (LDL) cholesterol less than 70 mg/dL   Hyperglycemia  Principal Problem:   Acute ST elevation myocardial infarction (STEMI) involving left circumflex coronary artery without development of Q waves (HCC) /   Coronary artery disease involving native coronary artery of native heart with unstable angina pectoris (HCC)/   Presence of drug coated stent in left circumflex coronary artery  Stable, has been ambulating in the hallway  On DAPT: Aspirin and Brilinta. (May need case manager to assist)  On beta blocker and statin  Echocardiogram pending     Essential hypertension - blood pressure much better controlled after starting his home medications.  Losartan 50 mg daily and metoprolol titrate and 50 mg twice a day.    Hyperlipidemia with target low density lipoprotein (LDL) cholesterol less than 70 mg/dL  High-dose atorvastatin. Was also on fenofibrate at home - will actually hold fenofibrate for now  Lipid panel ordered and pending for tomorrow.  Check hemoglobin A1c in the morning - has had blood sugar 126 this morning (hyperglycemia)  Anticipate transfer to lunch today and  possible discharge tomorrow.   For questions or updates, please contact [facility] Please consult [email or web address] for contact info under Cardiology/STEMI.      Signed, Bryan Lemma, MD  02/11/2017, 11:36 AM

## 2017-02-11 NOTE — Progress Notes (Signed)
[id number] Pt has been walking independently, so did not walk with him. Stated no CP with walks. MI education completed with pt who voiced understanding. Stressed brilinta with stent. Pt being seen by case manager. Reviewed MI restrictions, heart healthy food choices, NTG use, ex ed and CRP 2. Will refer to [facility] but pt's work schedule may not allow him to do. Luetta NuttingCharlene Tiney Zipper RN BSN 02/11/2017 2:42 PM

## 2017-02-11 NOTE — Progress Notes (Signed)
    #   1. S/W [facility] @ [facility] # [phone]    BRILINTA 90 MG BID   COVER- YES  CO-PAY- $ 25.00  PRIOR APPROVAL- NO   PREFERRED PHARMACY : [facility]

## 2017-02-12 ENCOUNTER — Encounter (HOSPITAL_COMMUNITY): Payer: Self-pay | Admitting: Physician Assistant

## 2017-02-12 DIAGNOSIS — R7303 Prediabetes: Secondary | ICD-10-CM

## 2017-02-12 LAB — HEMOGLOBIN A1C
HEMOGLOBIN A1C: 6.3 % — AB (ref 4.8–5.6)
MEAN PLASMA GLUCOSE: 134.11 mg/dL

## 2017-02-12 LAB — LIPID PANEL
CHOLESTEROL: 231 mg/dL — AB (ref 0–200)
HDL: 24 mg/dL — ABNORMAL LOW (ref >40)
LDL Cholesterol: 147 mg/dL — ABNORMAL HIGH (ref 0–99)
Total CHOL/HDL Ratio: 9.6 RATIO
Triglycerides: 298 mg/dL — ABNORMAL HIGH (ref <150)
VLDL: 60 mg/dL — ABNORMAL HIGH (ref 0–40)

## 2017-02-12 MED ORDER — METOPROLOL TARTRATE 50 MG PO TABS: 50.0000 mg | ORAL_TABLET | Freq: Two times a day (BID) | ORAL | 6 refills | Status: DC

## 2017-02-12 MED ORDER — TICAGRELOR 90 MG PO TABS: 90.0000 mg | ORAL_TABLET | Freq: Two times a day (BID) | ORAL | 3 refills | Status: DC

## 2017-02-12 MED ORDER — NITROGLYCERIN 0.4 MG SL SUBL: 0.4000 mg | SUBLINGUAL_TABLET | SUBLINGUAL | 3 refills | Status: DC | PRN

## 2017-02-12 MED ORDER — LOSARTAN POTASSIUM 50 MG PO TABS: 50.0000 mg | ORAL_TABLET | Freq: Every day | ORAL | 6 refills | Status: DC

## 2017-02-12 MED ORDER — ATORVASTATIN CALCIUM 80 MG PO TABS: 80.0000 mg | ORAL_TABLET | Freq: Every evening | ORAL | 6 refills | Status: DC

## 2017-02-12 NOTE — Progress Notes (Addendum)
0710 Bedside shift report, pt awake, resting in bed. Denies CP, SOB, distress. Pt's right radial post cath, level 0. No needs at this time, awaiting MD to see pt for further orders.   0745 Pt assessed, see flow sheet. NAD, no complaints at this time. WCTM.   1530 Pt discharged to home. Discharge instructions, new meds, medication list, follow up appointments, and wound care reviewed with pt, spouse and dgt. All questions answered and pt able to understand without assistance. INT and tele box removed without difficulty. Pt down to car via wheelchair with all personal belongings including cell phone, charger, & clothing.

## 2017-02-12 NOTE — Progress Notes (Signed)
Progress Note  Patient Name: Austin LovelessCharles J Catlin Date of Encounter: 02/12/2017  Primary Cardiologist: Verne Carrowhristopher McAlhany, MD   Subjective    Denies CP. Walking unit   Inpatient Medications    Scheduled Meds: . aspirin  81 mg Oral Daily  . atorvastatin  80 mg Oral q1800  . loratadine  10 mg Oral Daily  . losartan  50 mg Oral Daily  . metoprolol tartrate  50 mg Oral BID  . sodium chloride flush  3 mL Intravenous Q12H  . ticagrelor  90 mg Oral BID   Continuous Infusions: . sodium chloride 250 mL (02/10/17 2058)   PRN Meds: sodium chloride, acetaminophen, hydrALAZINE, ondansetron (ZOFRAN) IV, sodium chloride flush, traZODone   Vital Signs    Vitals:   02/12/17 0900 02/12/17 0915 02/12/17 1000 02/12/17 1100  BP: 116/90 116/90 116/84   Pulse: 82 90 86   Resp: 17     Temp:    98.4 F (36.9 C)  TempSrc:    Oral  SpO2: 95%  96%   Weight:      Height:        Intake/Output Summary (Last 24 hours) at 02/12/2017 1319 Last data filed at 02/12/2017 1000 Gross per 24 hour  Intake 807 ml  Output 450 ml  Net 357 ml   Filed Weights   02/10/17 0800 02/10/17 1505  Weight: 108.9 kg (240 lb) 109.2 kg (240 lb 11.9 oz)    Telemetry    Sinus rhythm 80s- Personally Reviewed  ECG    January 18: Normal sinus rhythm, rate 75 bpm. Normal EKG. - Personally Reviewed  Physical Exam   General:  Well appearing. No resp difficulty HEENT: normal Neck: supple. no JVD. Carotids 2+ bilat; no bruits. No lymphadenopathy or thryomegaly appreciated. Cor: PMI nondisplaced. Regular rate & rhythm. No rubs, gallops or murmurs. Lungs: clear Abdomen: obese soft, nontender, nondistended. No hepatosplenomegaly. No bruits or masses. Good bowel sounds. Extremities: no cyanosis, clubbing, rash, edema Neuro: alert & orientedx3, cranial nerves grossly intact. moves all 4 extremities w/o difficulty. Affect pleasant   Labs    Chemistry Recent Labs  Lab 02/10/17 0801 02/10/17 0814 02/11/17 0222    NA 137 138 137  K 4.0 4.1 4.0  CL 107 105 106  CO2 18*  --  21*  GLUCOSE 177* 183* 126*  BUN 11 14 12   CREATININE 1.12 1.00 1.08  CALCIUM 9.4  --  9.3  PROT 6.7  --   --   ALBUMIN 4.1  --   --   AST 24  --   --   ALT 26  --   --   ALKPHOS 65  --   --   BILITOT 0.9  --   --   GFRNONAA >60  --  >60  GFRAA >60  --  >60  ANIONGAP 12  --  10     Hematology Recent Labs  Lab 02/10/17 0801 02/10/17 0814 02/11/17 0222  WBC 10.9*  --  13.6*  RBC 4.99  --  4.51  HGB 14.9 15.0 13.6  HCT 42.9 44.0 39.3  MCV 86.0  --  87.1  MCH 29.9  --  30.2  MCHC 34.7  --  34.6  RDW 12.3  --  12.6  PLT 328  --  267    Cardiac Enzymes Recent Labs  Lab 02/10/17 0801 02/10/17 1620 02/10/17 2059 02/11/17 0224  TROPONINI <0.03 17.56* 27.78* 20.49*    Recent Labs  Lab 02/10/17 0812  TROPIPOC  0.00     BNPNo results for input(s): BNP, PROBNP in the last 168 hours.   DDimer No results for input(s): DDIMER in the last 168 hours.   Radiology    Dg Chest Portable 1 View  Result Date: 02/10/2017 CLINICAL DATA:  Code STEMI. EXAM: PORTABLE CHEST 1 VIEW COMPARISON:  03/17/2005. FINDINGS: Mediastinum and hilar structures normal. No focal infiltrate. Right costophrenic angle not completely imaged. Tiny left pleural effusion cannot be excluded. Borderline cardiomegaly. No pulmonary venous congestion. No acute bony abnormality. IMPRESSION: 1. Right costophrenic angle not imaged. Tiny left pleural effusion cannot be excluded. 2.  Borderline cardiomegaly.  No pulmonary venous congestion. Electronically Signed   By: Maisie Fus  Register   On: 02/10/2017 16:37    Cardiac Studies    CATH/PCI 02/11/2017  . Prox Cx to Mid Cx lesion is 100% stenosed -=- DES PCI STENT PROMUS PREM MR 3.5X24 --> 0% residual stenosis.  Prox LAD lesion is 50% stenosed.  Ost Cx to Prox Cx lesion is 30% stenosed.  Mid RCA lesion is 40% stenosed.  RPDA lesion is 30% stenosed.  Normal LV function and diastolic pressure. EF 55-60%.  No MR  Echocardiogram 02/11/17: Pending  Patient Profile     57 y.o. male with a history of untreated hypertension and hyperlipidemia (he takes medications when he can afford it) along with a strong family history with father having CAD-CABG at a relatively early age. He presented morning of 02/10/2017 the EMS after acute onset substernal chest pain radiating to both arms. In route, his EKG showed signs of subtle inferior ST elevations. Code STEMI was called upon arrival to ER and was taken directly to the cardiac catheterization lab. Found to have occluded circumflex treated with DES stent.   Assessment & Plan    Principal Problem:   Acute ST elevation myocardial infarction (STEMI) involving left circumflex coronary artery without development of Q waves (HCC) Active Problems:   Essential hypertension   Hyperlipidemia with target low density lipoprotein (LDL) cholesterol less than 70 mg/dL   Coronary artery disease involving native coronary artery of native heart with unstable angina pectoris (HCC)   Presence of drug coated stent in left circumflex coronary artery   Hyperglycemia  Principal Problem:   Acute ST elevation myocardial infarction (STEMI) involving left circumflex coronary artery without development of Q waves (HCC) /   Coronary artery disease involving native coronary artery of native heart with unstable angina pectoris (HCC)/   Presence of drug coated stent in left circumflex coronary artery  Stable, has been ambulating in the hallway without CP  On DAPT: Aspirin and Brilinta.   On beta blocker and statin  Echocardiogram EF 55-60% RV ok Personally reviewed    Essential hypertension - blood pressure much better controlled after starting his home medications.  Losartan 50 mg daily and metoprolol 50 bid. Will continue    Hyperlipidemia with target low density lipoprotein (LDL) cholesterol less than 70 mg/dL  High-dose atorvastatin. Was also on fenofibrate at home - can  stop fenofibrate for now  Hemoglobin A1c 6.3  Needs diet and weight loss   Ok for d/c today with CR and outpatient f/u   For questions or updates, please contact [facility] Please consult [email or web address] for contact info under Cardiology/STEMI.      Signed, Arvilla Meres, MD  02/12/2017, 1:19 PM

## 2017-02-12 NOTE — Discharge Summary (Signed)
Discharge Summary    Patient ID: Austin Mcknight,  MRN: [id number], DOB/AGE: [birth year] 57 y.o.  Admit date: 02/10/2017 Discharge date: 02/12/2017  Primary Care Provider: Lynnea FerrierPickard, Warren T Primary Cardiologist: Dr. Clifton JamesMcAlhany (but may follow up in [place])  Discharge Diagnoses    Principal Problem:   Acute ST elevation myocardial infarction (STEMI) involving left circumflex coronary artery without development of Q waves (HCC) Active Problems:   Essential hypertension   Hyperlipidemia with target low density lipoprotein (LDL) cholesterol less than 70 mg/dL   CAD in native artery   Presence of drug coated stent in left circumflex coronary artery   Pre-diabetes    Diagnostic Studies/Procedures    2d echo 02/11/17 Study Conclusions  - Left ventricle: The cavity size was normal. There was mild   concentric hypertrophy. Systolic function was normal. The   estimated ejection fraction was in the range of 55% to 60%.   Images were inadequate for LV wall motion assessment. Features   are consistent with a pseudonormal left ventricular filling   pattern, with concomitant abnormal relaxation and increased   filling pressure (grade 2 diastolic dysfunction). - Aortic valve: Mild focal calcification involving the left   coronary cusp. - Atrial septum: There was increased thickness of the septum,   consistent with lipomatous hypertrophy. - Pulmonary arteries: Systolic pressure could not be accurately   estimated. - Impressions: Normal LVF with mild LVH but inadequate for regional   wall motion assessment. Mild LAE, mild calcification of the left   coronary cusp, grade 2 DD. Suggest definity contrast to assess   wall motion more accurately.  Impressions:  - Normal LVF with mild LVH but inadequate for regional wall motion   assessment. Mild LAE, mild calcification of the left coronary   cusp, grade 2 DD. Suggest definity contrast to assess wall motion   more  accurately.  Procedures   Coronary/Graft Acute MI Revascularization  LEFT HEART CATH AND CORONARY ANGIOGRAPHY  Conclusion     Mid RCA lesion is 40% stenosed.  RPDA lesion is 30% stenosed.  Prox Cx to Mid Cx lesion is 100% stenosed.  Ost Cx to Prox Cx lesion is 30% stenosed.  Prox LAD lesion is 50% stenosed.  A drug-eluting stent was successfully placed using a STENT PROMUS PREM MR 3.5X24.  Post intervention, there is a 0% residual stenosis.  The left ventricular systolic function is normal.  LV end diastolic pressure is normal.  The left ventricular ejection fraction is 55-65% by visual estimate.  There is no mitral valve regurgitation.   1. Acute inferolateral STEMI secondary to acute occlusion of the mid Circumflex artery 2. Successful PTCA/DES x 1 mid Circumflex 3. Moderate stenosis in the proximal to mid LAD 4. Moderate stenosis mid RCA 5. Preserved LV systolic function  Recommendations: Will admit to ICU. Will continue ASA and Brilinta, start a beta blocker and statin. Aggrastat drip for 3 hours. Will plan echo.      _____________     History of Present Illness     Austin Mcknight is a 57 y.o. male with history of untreated hypertension and hyperlipidemia (he takes medications when he can afford it), OCD, strong family history with father having CAD-CABG at a relatively early age. [facility] with crushing chest pain starting approximately 5am the day of admission, with nausea, dyspnea and fatigue.  EMS was contacted and he was transported to [facility].  And route, his EKG went from no significant changes to  having some evidence of inferior ST elevations that were roughly 2 mm in II, 3 and aVF with subtle ST depressions in aVL V1 and V2 as well as V3.  Code STEMI was called in route, and upon arrival to [facility] he clearly had inferior injury pattern with 1-2 mm elevations in the inferior leads is noted. He was still having almost 10  out of 10 chest pain, but was no longer is nauseated.  He was pale and diaphoretic. He was transferred emergently to the cath lab.  Hospital Course     Acute STEMI/newly recognized CAD - cath showed mRCA 40%, 30% RPDA, 100% prox-mid Cx, 30% ostial-prox Cx, 50% prox LAD, LVEF 55-65%, normal LVEDP. He received Successful PTCA/DES x 1 mid Circumflex. He was started on ASA and Brilinta along with beta bocker and statin. 2D echo 02/11/17 showed EF 55-60%, grade 2 DD, increased thickness of the septum, consistent with lipomatous hypertrophy, mild LVH. Peak troponin 27. He was given 30 day free Brilinta rx, along with printed prescription for refills at his request. Advised to d/c Excedrin given extra ASA component. Advised no work until cleared in follow-up - work note given.  Essential HTN - improved with addition metoprolol 50mg  BID to regimen of losartan 50mg  daily.   Hyperlipidemia - started on high intensity statin for LDL 147, trig 231, HDL 24. If the patient is tolerating statin at time of follow-up appointment, would consider rechecking liver function/lipid panel in 6-8 weeks. Fenofibrate was stopped by Dr. Herbie Baltimore given statin initiation.  Pre-diabetes - A1C 6.3. Diet/weight loss recommended along with f/u PCP.  I have sent a message to our office's scheduling team requesting a follow-up appointment, and our office will call the patient with this information. Dr. Gala Romney has seen and examined the patient today and feels he is stable for discharge.  _____________  Discharge Vitals Blood pressure 105/79, pulse 81, temperature 98.4 F (36.9 C), temperature source Oral, resp. rate 17, height 5\' 11"  (1.803 m), weight 240 lb 11.9 oz (109.2 kg), SpO2 96 %.  Filed Weights   02/10/17 0800 02/10/17 1505  Weight: 240 lb (108.9 kg) 240 lb 11.9 oz (109.2 kg)    Labs & Radiologic Studies    CBC Recent Labs    02/10/17 0801 02/10/17 0814 02/11/17 0222  WBC 10.9*  --  13.6*  NEUTROABS 5.9  --    --   HGB 14.9 15.0 13.6  HCT 42.9 44.0 39.3  MCV 86.0  --  87.1  PLT 328  --  267   Basic Metabolic Panel Recent Labs    16/10/96 0801 02/10/17 0814 02/11/17 0222  NA 137 138 137  K 4.0 4.1 4.0  CL 107 105 106  CO2 18*  --  21*  GLUCOSE 177* 183* 126*  BUN 11 14 12   CREATININE 1.12 1.00 1.08  CALCIUM 9.4  --  9.3   Liver Function Tests Recent Labs    02/10/17 0801  AST 24  ALT 26  ALKPHOS 65  BILITOT 0.9  PROT 6.7  ALBUMIN 4.1   No results for input(s): LIPASE, AMYLASE in the last 72 hours. Cardiac Enzymes Recent Labs    02/10/17 1620 02/10/17 2059 02/11/17 0224  TROPONINI 17.56* 27.78* 20.49*   BNP Invalid input(s): POCBNP D-Dimer No results for input(s): DDIMER in the last 72 hours. Hemoglobin A1C Recent Labs    02/12/17 0752  HGBA1C 6.3*   Fasting Lipid Panel Recent Labs    02/12/17 0752  CHOL 231*  HDL 24*  LDLCALC 147*  TRIG 298*  CHOLHDL 9.6   _____________  Dg Chest Portable 1 View  Result Date: 02/10/2017 CLINICAL DATA:  Code STEMI. EXAM: PORTABLE CHEST 1 VIEW COMPARISON:  03/17/2005. FINDINGS: Mediastinum and hilar structures normal. No focal infiltrate. Right costophrenic angle not completely imaged. Tiny left pleural effusion cannot be excluded. Borderline cardiomegaly. No pulmonary venous congestion. No acute bony abnormality. IMPRESSION: 1. Right costophrenic angle not imaged. Tiny left pleural effusion cannot be excluded. 2.  Borderline cardiomegaly.  No pulmonary venous congestion. Electronically Signed   By: Maisie Fus  Register   On: 02/10/2017 16:37   Disposition   Pt is being discharged home today in good condition.  Follow-up Plans & Appointments    Follow-up Information    Kathleene Hazel, MD Follow up.   Specialty:  Cardiology Why:  Office will call you to arrange followup. Please call if you have not heard within 2 days. Contact information: [address] [place] [address] [phone]         Donita Brooks, MD Follow up.   Specialty:  Family Medicine Why:  Your A1C was 6.3, indicating pre-diabetes. Follow closely with PCP for this. Contact information: [address] [place] [address] [phone]          Discharge Instructions    Amb Referral to Cardiac Rehabilitation   Complete by:  As directed    Diagnosis:   STEMI Coronary Stents     Diet - low sodium heart healthy   Complete by:  As directed    Increase activity slowly   Complete by:  As directed    No driving for 2 weeks. No lifting over 10 lbs for 4 weeks. No sexual activity for 4 weeks. You may not return to work until cleared by your cardiologist. Keep procedure site clean & dry. If you notice increased pain, swelling, bleeding or pus, call/return!  You may shower, but no soaking baths/hot tubs/pools for 1 week.  Your Excedrin Migraine was stopped because it has extra aspirin. Since you are on Brilinta you are not allowed to take any more than 81mg  aspirin.      Discharge Medications   Allergies as of 02/12/2017   No Known Allergies     Medication List    STOP taking these medications   aspirin-acetaminophen-caffeine 250-250-65 MG tablet Commonly known as:  EXCEDRIN MIGRAINE   fenofibrate 160 MG tablet     TAKE these medications   aspirin 81 MG tablet Take 81 mg by mouth daily.   atorvastatin 80 MG tablet Commonly known as:  LIPITOR Take 1 tablet (80 mg total) by mouth every evening.   loratadine 10 MG dissolvable tablet Commonly known as:  CLARITIN REDITABS Take 10 mg by mouth daily.   losartan 50 MG tablet Commonly known as:  COZAAR Take 1 tablet (50 mg total) by mouth daily.   metoprolol tartrate 50 MG tablet Commonly known as:  LOPRESSOR Take 1 tablet (50 mg total) by mouth 2 (two) times daily.   nitroGLYCERIN 0.4 MG SL tablet Commonly known as:  NITROSTAT Place 1 tablet (0.4 mg total) under the tongue every 5 (five) minutes as needed for chest pain (up to 3  doses - call 911 if you have to take 3rd dose).   RANITIDINE 150 MAX STRENGTH PO Take 150 tablets by mouth as needed (heartburn).   ticagrelor 90 MG Tabs tablet Commonly known as:  [employer]  Take 1 tablet (90 mg total) by mouth 2 (two) times daily.        Allergies:  No Known Allergies  Aspirin prescribed at discharge?  Yes High Intensity Statin Prescribed? (Lipitor 40-80mg  or Crestor 20-40mg ): Yes Beta Blocker Prescribed? Yes For EF <40%, was ACEI/ARB Prescribed? No: N/a - normal EF ADP Receptor Inhibitor Prescribed? (i.e. Plavix etc.-Includes Medically Managed Patients): Yes For EF <40%, Aldosterone Inhibitor Prescribed? No: N/a normal EF Was EF assessed during THIS hospitalization? Yes Was Cardiac Rehab II ordered? (Included Medically managed Patients): Yes   Outstanding Labs/Studies   N/a  Duration of Discharge Encounter   Greater than 30 minutes including physician time.  Signed, Laurann Montana PA-C 02/12/2017, 3:12 PM   Patient seen and examined with Ronie Spies, PA-C. We discussed all aspects of the encounter. I agree with the assessment and plan as stated above.   He is stable post-MI. Ok for d/c. Discussed importance of compliance with DAPT and need for CR. Meds reviewed personally.   Arvilla Meres, MD  9:04 PM

## 2017-02-21 ENCOUNTER — Encounter: Payer: Self-pay | Admitting: *Deleted

## 2017-02-21 ENCOUNTER — Ambulatory Visit (INDEPENDENT_AMBULATORY_CARE_PROVIDER_SITE_OTHER): Payer: PRIVATE HEALTH INSURANCE | Admitting: Physician Assistant

## 2017-02-21 ENCOUNTER — Encounter: Payer: Self-pay | Admitting: Physician Assistant

## 2017-02-21 VITALS — BP 110/86 | HR 70 | Ht 71.0 in | Wt 239.0 lb

## 2017-02-21 DIAGNOSIS — R7303 Prediabetes: Secondary | ICD-10-CM | POA: Diagnosis not present

## 2017-02-21 DIAGNOSIS — E785 Hyperlipidemia, unspecified: Secondary | ICD-10-CM

## 2017-02-21 DIAGNOSIS — I10 Essential (primary) hypertension: Secondary | ICD-10-CM | POA: Diagnosis not present

## 2017-02-21 DIAGNOSIS — I2510 Atherosclerotic heart disease of native coronary artery without angina pectoris: Secondary | ICD-10-CM

## 2017-02-21 DIAGNOSIS — Z8249 Family history of ischemic heart disease and other diseases of the circulatory system: Secondary | ICD-10-CM | POA: Insufficient documentation

## 2017-02-21 MED ORDER — METOPROLOL TARTRATE 50 MG PO TABS: 50.0000 mg | ORAL_TABLET | Freq: Two times a day (BID) | ORAL | 3 refills | Status: DC

## 2017-02-21 MED ORDER — TICAGRELOR 90 MG PO TABS: 90.0000 mg | ORAL_TABLET | Freq: Two times a day (BID) | ORAL | 3 refills | Status: DC
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
## 2017-02-21 NOTE — Patient Instructions (Addendum)
Medication Instructions:  Your physician recommends that you continue on your current medications as directed. Please refer to the Current Medication list given to you today.   Labwork: 4 WEEKS AT THE [place] LOCATION::  FASTING LIPID & LIFT  Testing/Procedures: None ordered  Follow-Up: Your physician recommends that you schedule a follow-up appointment in: 2-3 MONTHS WITH DR BRANCH IN THE [place] OFFICE   Any Other Special Instructions Will Be Listed Below (If Applicable).     If you need a refill on your cardiac medications before your next appointment, please call your pharmacy.

## 2017-03-11 ENCOUNTER — Other Ambulatory Visit: Payer: Self-pay | Admitting: Physician Assistant

## 2017-03-14 ENCOUNTER — Other Ambulatory Visit: Payer: Self-pay | Admitting: Physician Assistant

## 2017-03-15 ENCOUNTER — Other Ambulatory Visit (HOSPITAL_COMMUNITY)
Admission: RE | Admit: 2017-03-15 | Discharge: 2017-03-15 | Disposition: A | Discharge status: Home / Self Care | Payer: PRIVATE HEALTH INSURANCE | Source: Ambulatory Visit | Attending: Physician Assistant | Admitting: Physician Assistant

## 2017-03-15 ENCOUNTER — Telehealth: Payer: Self-pay | Admitting: *Deleted

## 2017-03-15 DIAGNOSIS — E785 Hyperlipidemia, unspecified: Secondary | ICD-10-CM | POA: Insufficient documentation

## 2017-03-15 DIAGNOSIS — R7303 Prediabetes: Secondary | ICD-10-CM | POA: Insufficient documentation

## 2017-03-15 DIAGNOSIS — I10 Essential (primary) hypertension: Secondary | ICD-10-CM | POA: Insufficient documentation

## 2017-03-15 DIAGNOSIS — I2510 Atherosclerotic heart disease of native coronary artery without angina pectoris: Secondary | ICD-10-CM | POA: Insufficient documentation

## 2017-03-15 LAB — LIPID PANEL
CHOL/HDL RATIO: 7.4 ratio
CHOLESTEROL: 192 mg/dL (ref 0–200)
HDL: 26 mg/dL — AB (ref >40)
LDL Cholesterol: UNDETERMINED mg/dL (ref 0–99)
TRIGLYCERIDES: 434 mg/dL — AB (ref <150)
VLDL: UNDETERMINED mg/dL (ref 0–40)

## 2017-03-15 LAB — HEPATIC FUNCTION PANEL
ALK PHOS: 86 U/L (ref 38–126)
ALT: 27 U/L (ref 17–63)
AST: 21 U/L (ref 15–41)
Albumin: 4.3 g/dL (ref 3.5–5.0)
BILIRUBIN TOTAL: 0.6 mg/dL (ref 0.3–1.2)
Total Protein: 7.5 g/dL (ref 6.5–8.1)

## 2017-03-15 MED ORDER — FENOFIBRATE 145 MG PO TABS: 145.0000 mg | ORAL_TABLET | Freq: Every day | ORAL | 1 refills | Status: DC

## 2017-03-15 NOTE — Telephone Encounter (Signed)
-----   Message from Dyann KiefMichele M Lenze, PA-C sent at 03/15/2017 11:07 AM EST ----- Asked patient if he is taking his Lipitor daily?  Triglycerides very high.  Recommend fenofibrate 145 mg daily in addition to Lipitor

## 2017-04-25 ENCOUNTER — Encounter: Payer: Self-pay | Admitting: Cardiology

## 2017-04-25 ENCOUNTER — Ambulatory Visit (INDEPENDENT_AMBULATORY_CARE_PROVIDER_SITE_OTHER): Payer: PRIVATE HEALTH INSURANCE | Admitting: Cardiology

## 2017-04-25 VITALS — BP 120/84 | HR 77 | Ht 71.0 in | Wt 237.0 lb

## 2017-04-25 DIAGNOSIS — E782 Mixed hyperlipidemia: Secondary | ICD-10-CM

## 2017-04-25 DIAGNOSIS — I2510 Atherosclerotic heart disease of native coronary artery without angina pectoris: Secondary | ICD-10-CM

## 2017-04-25 DIAGNOSIS — I10 Essential (primary) hypertension: Secondary | ICD-10-CM

## 2017-04-25 DIAGNOSIS — R5383 Other fatigue: Secondary | ICD-10-CM

## 2017-04-25 NOTE — Patient Instructions (Signed)
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
## 2017-08-05 ENCOUNTER — Encounter: Payer: Self-pay | Admitting: Family Medicine

## 2017-08-05 ENCOUNTER — Ambulatory Visit: Payer: No Typology Code available for payment source | Admitting: Family Medicine

## 2017-08-05 VITALS — BP 130/92 | HR 70 | Temp 98.2°F | Resp 16 | Ht 69.0 in | Wt 237.0 lb

## 2017-08-05 DIAGNOSIS — I10 Essential (primary) hypertension: Secondary | ICD-10-CM | POA: Diagnosis not present

## 2017-08-05 DIAGNOSIS — Z125 Encounter for screening for malignant neoplasm of prostate: Secondary | ICD-10-CM | POA: Diagnosis not present

## 2017-08-05 DIAGNOSIS — I2584 Coronary atherosclerosis due to calcified coronary lesion: Secondary | ICD-10-CM | POA: Diagnosis not present

## 2017-08-05 DIAGNOSIS — Z955 Presence of coronary angioplasty implant and graft: Secondary | ICD-10-CM | POA: Diagnosis not present

## 2017-08-05 DIAGNOSIS — R7303 Prediabetes: Secondary | ICD-10-CM

## 2017-08-05 DIAGNOSIS — R5382 Chronic fatigue, unspecified: Secondary | ICD-10-CM | POA: Diagnosis not present

## 2017-08-05 DIAGNOSIS — E785 Hyperlipidemia, unspecified: Secondary | ICD-10-CM

## 2017-08-05 DIAGNOSIS — I2510 Atherosclerotic heart disease of native coronary artery without angina pectoris: Secondary | ICD-10-CM

## 2017-08-05 MED ORDER — NITROGLYCERIN 0.4 MG SL SUBL: 0.4000 mg | SUBLINGUAL_TABLET | SUBLINGUAL | 1 refills | Status: AC | PRN

## 2017-08-05 MED ORDER — ZOLPIDEM TARTRATE 10 MG PO TABS: 10.0000 mg | ORAL_TABLET | Freq: Every evening | ORAL | 1 refills | Status: DC | PRN
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
## 2017-08-08 LAB — CBC WITH DIFFERENTIAL/PLATELET
BASOS ABS: 82 {cells}/uL (ref 0–200)
Basophils Relative: 0.8 %
EOS ABS: 418 {cells}/uL (ref 15–500)
Eosinophils Relative: 4.1 %
HEMATOCRIT: 42.8 % (ref 38.5–50.0)
Hemoglobin: 14.5 g/dL (ref 13.2–17.1)
LYMPHS ABS: 3233 {cells}/uL (ref 850–3900)
MCH: 30.1 pg (ref 27.0–33.0)
MCHC: 33.9 g/dL (ref 32.0–36.0)
MCV: 88.8 fL (ref 80.0–100.0)
MPV: 9.8 fL (ref 7.5–12.5)
Monocytes Relative: 7.3 %
NEUTROS PCT: 56.1 %
Neutro Abs: 5722 cells/uL (ref 1500–7800)
Platelets: 339 10*3/uL (ref 140–400)
RBC: 4.82 10*6/uL (ref 4.20–5.80)
RDW: 12.1 % (ref 11.0–15.0)
Total Lymphocyte: 31.7 %
WBC mixed population: 745 cells/uL (ref 200–950)
WBC: 10.2 10*3/uL (ref 3.8–10.8)

## 2017-08-08 LAB — COMPLETE METABOLIC PANEL WITH GFR
AG RATIO: 2.2 (calc) (ref 1.0–2.5)
ALBUMIN MSPROF: 5 g/dL (ref 3.6–5.1)
ALKALINE PHOSPHATASE (APISO): 53 U/L (ref 40–115)
ALT: 30 U/L (ref 9–46)
AST: 23 U/L (ref 10–35)
BILIRUBIN TOTAL: 0.7 mg/dL (ref 0.2–1.2)
BUN: 21 mg/dL (ref 7–25)
CHLORIDE: 103 mmol/L (ref 98–110)
CO2: 25 mmol/L (ref 20–32)
Calcium: 10.3 mg/dL (ref 8.6–10.3)
Creat: 1.28 mg/dL (ref 0.70–1.33)
GFR, Est African American: 73 mL/min/{1.73_m2} (ref >=60)
GFR, Est Non African American: 63 mL/min/{1.73_m2} (ref >=60)
GLOBULIN: 2.3 g/dL (ref 1.9–3.7)
Glucose, Bld: 101 mg/dL — ABNORMAL HIGH (ref 65–99)
POTASSIUM: 4.7 mmol/L (ref 3.5–5.3)
SODIUM: 139 mmol/L (ref 135–146)
Total Protein: 7.3 g/dL (ref 6.1–8.1)

## 2017-08-08 LAB — TESTOSTERONE TOTAL,FREE,BIO, MALES
Albumin: 5 g/dL (ref 3.6–5.1)
SEX HORMONE BINDING: 21 nmol/L (ref 10–50)
TESTOSTERONE: 189 ng/dL — AB (ref 250–827)

## 2017-08-08 LAB — TSH: TSH: 8.88 m[IU]/L — AB (ref 0.40–4.50)

## 2017-08-08 LAB — LIPID PANEL
CHOL/HDL RATIO: 5.7 (calc) — AB (ref <5.0)
CHOLESTEROL: 193 mg/dL (ref <200)
HDL: 34 mg/dL — ABNORMAL LOW (ref >40)
LDL Cholesterol (Calc): 127 mg/dL (calc) — ABNORMAL HIGH
Non-HDL Cholesterol (Calc): 159 mg/dL (calc) — ABNORMAL HIGH (ref <130)
Triglycerides: 200 mg/dL — ABNORMAL HIGH (ref <150)

## 2017-08-08 LAB — HEMOGLOBIN A1C
Hgb A1c MFr Bld: 6.4 % of total Hgb — ABNORMAL HIGH (ref <5.7)
MEAN PLASMA GLUCOSE: 137 (calc)
eAG (mmol/L): 7.6 (calc)

## 2017-08-08 LAB — VITAMIN B12: VITAMIN B 12: 485 pg/mL (ref 200–1100)

## 2017-08-08 LAB — PSA: PSA: 0.5 ng/mL (ref <=4.0)

## 2017-08-10 ENCOUNTER — Other Ambulatory Visit: Payer: Self-pay | Admitting: Family Medicine

## 2017-08-10 ENCOUNTER — Encounter: Payer: Self-pay | Admitting: Family Medicine

## 2017-08-10 MED ORDER — EVOLOCUMAB 140 MG/ML ~~LOC~~ SOSY: 140.0000 mg | PREFILLED_SYRINGE | SUBCUTANEOUS | 3 refills | Status: DC

## 2017-08-11 ENCOUNTER — Telehealth: Payer: Self-pay | Admitting: Family Medicine

## 2017-08-11 NOTE — Telephone Encounter (Signed)
PA Submitted through [email or web address] and received the following:  Form was faxed to insurance via cover my meds.

## 2017-08-15 ENCOUNTER — Other Ambulatory Visit: Payer: Self-pay | Admitting: Cardiology

## 2017-08-15 MED ORDER — LOSARTAN POTASSIUM 50 MG PO TABS: 50.0000 mg | ORAL_TABLET | Freq: Every day | ORAL | 6 refills | Status: DC

## 2017-08-15 MED ORDER — TICAGRELOR 90 MG PO TABS: 90.0000 mg | ORAL_TABLET | Freq: Two times a day (BID) | ORAL | 3 refills | Status: DC

## 2017-08-15 MED ORDER — ATORVASTATIN CALCIUM 80 MG PO TABS: 80.0000 mg | ORAL_TABLET | Freq: Every evening | ORAL | 6 refills | Status: DC

## 2017-08-15 NOTE — Telephone Encounter (Signed)
Pt is needing refills on these, he's not due till Sept to see Dr. Wyline MoodBranch   losartan (COZAAR) 50 MG tablet [[id number]]  atorvastatin (LIPITOR) 80 MG tablet [[id number]][[id number]] [facility]    BRILINTA 90 MG TABS tablet [[id number]][[id number]] [place] [facility] [place]

## 2017-08-15 NOTE — Telephone Encounter (Signed)
[facility] [facility] [place] [facility]   [facility]inta [facility]

## 2017-08-19 ENCOUNTER — Emergency Department (HOSPITAL_COMMUNITY)
Admission: EM | Admit: 2017-08-19 | Discharge: 2017-08-19 | Disposition: A | Discharge status: Home / Self Care | Payer: PRIVATE HEALTH INSURANCE | Attending: Emergency Medicine | Admitting: Emergency Medicine

## 2017-08-19 ENCOUNTER — Emergency Department (HOSPITAL_COMMUNITY): Payer: PRIVATE HEALTH INSURANCE

## 2017-08-19 ENCOUNTER — Encounter (HOSPITAL_COMMUNITY): Payer: Self-pay | Admitting: Emergency Medicine

## 2017-08-19 ENCOUNTER — Other Ambulatory Visit: Payer: Self-pay

## 2017-08-19 DIAGNOSIS — Z87891 Personal history of nicotine dependence: Secondary | ICD-10-CM | POA: Insufficient documentation

## 2017-08-19 DIAGNOSIS — Z79899 Other long term (current) drug therapy: Secondary | ICD-10-CM | POA: Insufficient documentation

## 2017-08-19 DIAGNOSIS — Z7982 Long term (current) use of aspirin: Secondary | ICD-10-CM | POA: Insufficient documentation

## 2017-08-19 DIAGNOSIS — R109 Unspecified abdominal pain: Secondary | ICD-10-CM | POA: Diagnosis present

## 2017-08-19 DIAGNOSIS — E039 Hypothyroidism, unspecified: Secondary | ICD-10-CM | POA: Diagnosis not present

## 2017-08-19 DIAGNOSIS — I2510 Atherosclerotic heart disease of native coronary artery without angina pectoris: Secondary | ICD-10-CM | POA: Insufficient documentation

## 2017-08-19 DIAGNOSIS — M7918 Myalgia, other site: Secondary | ICD-10-CM

## 2017-08-19 LAB — CBC WITH DIFFERENTIAL/PLATELET
ABS IMMATURE GRANULOCYTES: 0 10*3/uL (ref 0.0–0.1)
BASOS PCT: 1 %
Basophils Absolute: 0.1 10*3/uL (ref 0.0–0.1)
Eosinophils Absolute: 0.4 10*3/uL (ref 0.0–0.7)
Eosinophils Relative: 5 %
HEMATOCRIT: 40.1 % (ref 39.0–52.0)
HEMOGLOBIN: 13.4 g/dL (ref 13.0–17.0)
Immature Granulocytes: 0 %
LYMPHS ABS: 3.5 10*3/uL (ref 0.7–4.0)
Lymphocytes Relative: 39 %
MCH: 30.1 pg (ref 26.0–34.0)
MCHC: 33.4 g/dL (ref 30.0–36.0)
MCV: 90.1 fL (ref 78.0–100.0)
MONO ABS: 0.7 10*3/uL (ref 0.1–1.0)
MONOS PCT: 8 %
NEUTROS ABS: 4.2 10*3/uL (ref 1.7–7.7)
Neutrophils Relative %: 47 %
Platelets: 308 10*3/uL (ref 150–400)
RBC: 4.45 MIL/uL (ref 4.22–5.81)
RDW: 12.3 % (ref 11.5–15.5)
WBC: 8.9 10*3/uL (ref 4.0–10.5)

## 2017-08-19 LAB — URINALYSIS, ROUTINE W REFLEX MICROSCOPIC
BILIRUBIN URINE: NEGATIVE
GLUCOSE, UA: NEGATIVE mg/dL
HGB URINE DIPSTICK: NEGATIVE
KETONES UR: NEGATIVE mg/dL
LEUKOCYTES UA: NEGATIVE
Nitrite: NEGATIVE
Protein, ur: NEGATIVE mg/dL
SPECIFIC GRAVITY, URINE: 1.004 — AB (ref 1.005–1.030)
pH: 6 (ref 5.0–8.0)

## 2017-08-19 LAB — BASIC METABOLIC PANEL
Anion gap: 10 (ref 5–15)
BUN: 18 mg/dL (ref 6–20)
CHLORIDE: 105 mmol/L (ref 98–111)
CO2: 25 mmol/L (ref 22–32)
Calcium: 9.9 mg/dL (ref 8.9–10.3)
Creatinine, Ser: 1.34 mg/dL — ABNORMAL HIGH (ref 0.61–1.24)
GFR calc Af Amer: >60 mL/min (ref >60)
GFR calc non Af Amer: 58 mL/min — ABNORMAL LOW (ref >60)
GLUCOSE: 92 mg/dL (ref 70–99)
POTASSIUM: 4 mmol/L (ref 3.5–5.1)
Sodium: 140 mmol/L (ref 135–145)

## 2017-08-19 MED ORDER — OXYCODONE-ACETAMINOPHEN 5-325 MG PO TABS
1.0000 | ORAL_TABLET | Freq: Once | ORAL | Status: AC
  Administered 2017-08-19: 1 via ORAL
  Filled 2017-08-19: qty 1

## 2017-08-19 MED ORDER — OXYCODONE-ACETAMINOPHEN 5-325 MG PO TABS: 1.0000 | ORAL_TABLET | ORAL | 0 refills | Status: DC | PRN

## 2017-08-19 MED ORDER — ONDANSETRON 4 MG PO TBDP
4.0000 mg | ORAL_TABLET | Freq: Once | ORAL | Status: AC
  Administered 2017-08-19: 4 mg via ORAL
  Filled 2017-08-19: qty 1

## 2017-08-19 MED ORDER — CYCLOBENZAPRINE HCL 10 MG PO TABS: 10.0000 mg | ORAL_TABLET | Freq: Two times a day (BID) | ORAL | 0 refills | Status: DC | PRN

## 2017-08-19 MED ORDER — IBUPROFEN 600 MG PO TABS
600.0000 mg | ORAL_TABLET | Freq: Four times a day (QID) | ORAL | 0 refills | Status: DC | PRN
Start: 2017-08-19 — End: 2018-03-24

## 2017-08-19 NOTE — ED Notes (Signed)
ED Provider at bedside. 

## 2017-08-19 NOTE — Discharge Instructions (Addendum)
Return to the ED if you develop any fever, severe pain, vomiting, new concern. REst the back over the weekend, apply warm compresses, take medications as prescribed. Follow up with dr. Tanya NonesPickard if pain is no better in 2-3 days.

## 2017-08-19 NOTE — ED Triage Notes (Signed)
Patient in ED c/o of kidney stones that he's had for about a week. States he's had them before and has been "dealing with the pain by drinking water". No other urinary symptoms at this time. Pain 10/10. VSS in triage.

## 2017-08-19 NOTE — ED Provider Notes (Signed)
Patient placed in Quick Look pathway, seen and evaluated   Chief Complaint: Left flank pain  HPI: Patient with history of kidney stones presents with 1 week of left flank pain, worsening today.  Patient has been taking Tylenol and drinking a lot of water trying to pass it.  This is not been successful.  He denies fevers or urinary symptoms.  He last kidney stone was about 3 years ago.  He has been able to pass all of them except one which required lithotripsy.  No other abdominal pain.  ROS:  Positive ROS: (+) Flank pain Negative ROS: (-) Dysuria  Physical Exam:   Gen: No distress  Neuro: Awake and Alert  Skin: Warm    Focused Exam: General patient appears uncomfortable, holding the left side; heart RRR, nml S1,S2, no m/r/g; Lungs CTAB; Abd soft, NT, no rebound or guarding; Ext 2+ pedal pulses bilaterally, no edema.  BP (!) 123/92 (BP Location: Right Arm)   Pulse 86   Temp 97.9 F (36.6 C) (Oral)   Resp 18   Ht 5\' 11"  (1.803 m)   Wt 112 kg (247 lb)   SpO2 98%   BMI 34.45 kg/m   Plan: Suspect ureteral colic, will check kidney function and UA to ensure no infection.  CT renal ordered to confirm stone and check location given symptoms present for 1 week and not improving.  Initiation of care has begun. The patient has been counseled on the process, plan, and necessity for staying for the completion/evaluation, and the remainder of the medical screening examination    Renne CriglerGeiple, Avyn Coate, Cordelia Poche-C 08/19/17 1753    Cathren LaineSteinl, Kevin, MD 08/19/17 1800
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
## 2017-08-24 NOTE — Telephone Encounter (Signed)
Called ins and they are having problems with cover my meds and will fax over the PA forms.

## 2017-08-26 NOTE — Telephone Encounter (Signed)
Form faxed to insurance for PA on [employer]

## 2017-09-02 ENCOUNTER — Encounter: Payer: Self-pay | Admitting: Family Medicine

## 2017-09-02 ENCOUNTER — Ambulatory Visit (INDEPENDENT_AMBULATORY_CARE_PROVIDER_SITE_OTHER): Payer: No Typology Code available for payment source | Admitting: Family Medicine

## 2017-09-02 VITALS — BP 118/70 | HR 98 | Temp 98.6°F | Resp 18 | Ht 69.0 in | Wt 240.0 lb

## 2017-09-02 DIAGNOSIS — M25561 Pain in right knee: Secondary | ICD-10-CM

## 2017-09-02 DIAGNOSIS — R5382 Chronic fatigue, unspecified: Secondary | ICD-10-CM

## 2017-09-02 DIAGNOSIS — M720 Palmar fascial fibromatosis [Dupuytren]: Secondary | ICD-10-CM | POA: Diagnosis not present
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
## 2017-09-14 ENCOUNTER — Other Ambulatory Visit: Payer: Self-pay | Admitting: Family Medicine

## 2017-09-14 NOTE — Telephone Encounter (Signed)
Requesting refill    Ambien  LOV: 09/02/17  LRF:  08/05/17

## 2017-09-16 NOTE — Telephone Encounter (Signed)
Called insurance to follow up on PA for Repatha and per Selena BattenKim from [employer]	[place] from his insurance plan therefore they will not cover it at all.

## 2017-09-19 ENCOUNTER — Other Ambulatory Visit: Payer: Self-pay | Admitting: Physician Assistant

## 2017-09-19 NOTE — Telephone Encounter (Signed)
Please notify patient. I would add zetia 10 mg a day to lipitor 80 mg a day and recheck labs in 3 months.

## 2017-10-05 MED ORDER — EZETIMIBE 10 MG PO TABS: 10.0000 mg | ORAL_TABLET | Freq: Every day | ORAL | 3 refills | Status: DC

## 2017-10-05 NOTE — Telephone Encounter (Signed)
Pt aware and med called out

## 2017-10-28 ENCOUNTER — Ambulatory Visit: Payer: Self-pay | Admitting: Cardiology

## 2017-10-31 ENCOUNTER — Other Ambulatory Visit: Payer: Self-pay | Admitting: Family Medicine

## 2017-10-31 NOTE — Telephone Encounter (Signed)
Requesting refill    Ambien  LOV: 09/02/17  LRF:  09/15/17

## 2017-11-21 ENCOUNTER — Other Ambulatory Visit: Payer: Self-pay | Admitting: Family Medicine

## 2017-11-21 NOTE — Telephone Encounter (Signed)
Last OV 09/02/2017 Last refill 10/31/2017 Ok to refill?

## 2017-12-09 ENCOUNTER — Ambulatory Visit (INDEPENDENT_AMBULATORY_CARE_PROVIDER_SITE_OTHER): Payer: PRIVATE HEALTH INSURANCE | Admitting: Cardiology

## 2017-12-09 ENCOUNTER — Encounter: Payer: Self-pay | Admitting: Cardiology

## 2017-12-09 VITALS — BP 122/76 | HR 70 | Ht 71.0 in | Wt 236.0 lb

## 2017-12-09 DIAGNOSIS — I10 Essential (primary) hypertension: Secondary | ICD-10-CM

## 2017-12-09 DIAGNOSIS — I2510 Atherosclerotic heart disease of native coronary artery without angina pectoris: Secondary | ICD-10-CM

## 2017-12-09 DIAGNOSIS — E782 Mixed hyperlipidemia: Secondary | ICD-10-CM

## 2017-12-09 NOTE — Patient Instructions (Addendum)
Medication Instructions:  [facility]  Labwork: NONE  Testing/Procedures: NONE  Follow-Up: Your physician wants you to follow-up in: 6 MONTHS.  You will receive a reminder letter in the mail two months in advance. If you don't receive a letter, please call our office to schedule the follow-up appointment.   Any Other Special Instructions Will Be Listed Below (If Applicable).     If you need a refill on your cardiac medications before your next appointment, please call your pharmacy.
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
## 2017-12-15 ENCOUNTER — Other Ambulatory Visit: Payer: Self-pay | Admitting: Family Medicine

## 2017-12-15 NOTE — Telephone Encounter (Signed)
Ok to refill??  Last office visit 09/02/2017.  Last refill 11/21/2017.

## 2017-12-26 ENCOUNTER — Other Ambulatory Visit: Payer: Self-pay | Admitting: Family Medicine

## 2017-12-26 NOTE — Telephone Encounter (Signed)
Requesting refill    Ambien  LOV: 09/02/17  LRF:  12/15/17

## 2018-03-14 ENCOUNTER — Other Ambulatory Visit: Payer: Self-pay | Admitting: Family Medicine

## 2018-03-14 NOTE — Telephone Encounter (Signed)
Ok to refill??  Last office visit 09/02/2017.  Last refill 12/26/2017.

## 2018-03-20 ENCOUNTER — Other Ambulatory Visit: Payer: Self-pay | Admitting: Physician Assistant

## 2018-03-24 ENCOUNTER — Ambulatory Visit: Payer: No Typology Code available for payment source | Admitting: Family Medicine

## 2018-03-24 ENCOUNTER — Encounter: Payer: Self-pay | Admitting: Family Medicine

## 2018-03-24 VITALS — BP 126/90 | HR 84 | Temp 98.5°F | Resp 16 | Ht 69.0 in | Wt 241.0 lb

## 2018-03-24 DIAGNOSIS — E038 Other specified hypothyroidism: Secondary | ICD-10-CM | POA: Diagnosis not present

## 2018-03-24 DIAGNOSIS — I2584 Coronary atherosclerosis due to calcified coronary lesion: Secondary | ICD-10-CM

## 2018-03-24 DIAGNOSIS — R7303 Prediabetes: Secondary | ICD-10-CM | POA: Diagnosis not present

## 2018-03-24 DIAGNOSIS — I10 Essential (primary) hypertension: Secondary | ICD-10-CM | POA: Diagnosis not present

## 2018-03-24 DIAGNOSIS — I2510 Atherosclerotic heart disease of native coronary artery without angina pectoris: Secondary | ICD-10-CM | POA: Diagnosis not present

## 2018-03-24 DIAGNOSIS — G609 Hereditary and idiopathic neuropathy, unspecified: Secondary | ICD-10-CM

## 2018-03-24 MED ORDER — GABAPENTIN 300 MG PO CAPS: 300.0000 mg | ORAL_CAPSULE | Freq: Three times a day (TID) | ORAL | 3 refills | Status: DC
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
## 2018-03-25 LAB — COMPLETE METABOLIC PANEL WITH GFR
AG RATIO: 1.8 (calc) (ref 1.0–2.5)
ALT: 20 U/L (ref 9–46)
AST: 15 U/L (ref 10–35)
Albumin: 4.7 g/dL (ref 3.6–5.1)
Alkaline phosphatase (APISO): 64 U/L (ref 35–144)
BUN: 21 mg/dL (ref 7–25)
CALCIUM: 10.7 mg/dL — AB (ref 8.6–10.3)
CO2: 29 mmol/L (ref 20–32)
CREATININE: 1.08 mg/dL (ref 0.70–1.33)
Chloride: 103 mmol/L (ref 98–110)
GFR, EST AFRICAN AMERICAN: 89 mL/min/{1.73_m2} (ref >=60)
GFR, EST NON AFRICAN AMERICAN: 77 mL/min/{1.73_m2} (ref >=60)
GLUCOSE: 227 mg/dL — AB (ref 65–99)
Globulin: 2.6 g/dL (calc) (ref 1.9–3.7)
Potassium: 4.6 mmol/L (ref 3.5–5.3)
Sodium: 139 mmol/L (ref 135–146)
TOTAL PROTEIN: 7.3 g/dL (ref 6.1–8.1)
Total Bilirubin: 0.4 mg/dL (ref 0.2–1.2)

## 2018-03-25 LAB — CBC WITH DIFFERENTIAL/PLATELET
Absolute Monocytes: 797 cells/uL (ref 200–950)
BASOS ABS: 58 {cells}/uL (ref 0–200)
Basophils Relative: 0.6 %
EOS ABS: 384 {cells}/uL (ref 15–500)
EOS PCT: 4 %
HCT: 42.1 % (ref 38.5–50.0)
Hemoglobin: 13.7 g/dL (ref 13.2–17.1)
Lymphs Abs: 2362 cells/uL (ref 850–3900)
MCH: 29.5 pg (ref 27.0–33.0)
MCHC: 32.5 g/dL (ref 32.0–36.0)
MCV: 90.5 fL (ref 80.0–100.0)
MONOS PCT: 8.3 %
MPV: 9.9 fL (ref 7.5–12.5)
NEUTROS ABS: 6000 {cells}/uL (ref 1500–7800)
Neutrophils Relative %: 62.5 %
PLATELETS: 335 10*3/uL (ref 140–400)
RBC: 4.65 10*6/uL (ref 4.20–5.80)
RDW: 11.9 % (ref 11.0–15.0)
TOTAL LYMPHOCYTE: 24.6 %
WBC: 9.6 10*3/uL (ref 3.8–10.8)

## 2018-03-25 LAB — HEMOGLOBIN A1C
EAG (MMOL/L): 10 (calc)
Hgb A1c MFr Bld: 7.9 % of total Hgb — ABNORMAL HIGH (ref <5.7)
MEAN PLASMA GLUCOSE: 180 (calc)

## 2018-03-25 LAB — LIPID PANEL
Cholesterol: 157 mg/dL (ref <200)
HDL: 29 mg/dL — ABNORMAL LOW (ref >=40)
LDL CHOLESTEROL (CALC): 97 mg/dL
NON-HDL CHOLESTEROL (CALC): 128 mg/dL (ref <130)
TRIGLYCERIDES: 214 mg/dL — AB (ref <150)
Total CHOL/HDL Ratio: 5.4 (calc) — ABNORMAL HIGH (ref <5.0)

## 2018-03-25 LAB — VITAMIN B12: VITAMIN B 12: 419 pg/mL (ref 200–1100)

## 2018-03-25 LAB — TSH: TSH: 9.21 m[IU]/L — AB (ref 0.40–4.50)

## 2018-03-26 ENCOUNTER — Emergency Department (HOSPITAL_COMMUNITY)
Admission: EM | Admit: 2018-03-26 | Discharge: 2018-03-27 | Disposition: A | Discharge status: Home / Self Care | Payer: PRIVATE HEALTH INSURANCE | Attending: Emergency Medicine | Admitting: Emergency Medicine

## 2018-03-26 ENCOUNTER — Emergency Department (HOSPITAL_COMMUNITY): Payer: PRIVATE HEALTH INSURANCE

## 2018-03-26 ENCOUNTER — Other Ambulatory Visit: Payer: Self-pay

## 2018-03-26 ENCOUNTER — Encounter (HOSPITAL_COMMUNITY): Payer: Self-pay

## 2018-03-26 DIAGNOSIS — Z7982 Long term (current) use of aspirin: Secondary | ICD-10-CM | POA: Insufficient documentation

## 2018-03-26 DIAGNOSIS — R631 Polydipsia: Secondary | ICD-10-CM | POA: Diagnosis not present

## 2018-03-26 DIAGNOSIS — R739 Hyperglycemia, unspecified: Secondary | ICD-10-CM | POA: Diagnosis not present

## 2018-03-26 DIAGNOSIS — R509 Fever, unspecified: Secondary | ICD-10-CM

## 2018-03-26 DIAGNOSIS — Z79899 Other long term (current) drug therapy: Secondary | ICD-10-CM | POA: Insufficient documentation

## 2018-03-26 DIAGNOSIS — R7303 Prediabetes: Secondary | ICD-10-CM | POA: Insufficient documentation

## 2018-03-26 DIAGNOSIS — I2510 Atherosclerotic heart disease of native coronary artery without angina pectoris: Secondary | ICD-10-CM | POA: Insufficient documentation

## 2018-03-26 DIAGNOSIS — E039 Hypothyroidism, unspecified: Secondary | ICD-10-CM | POA: Insufficient documentation

## 2018-03-26 DIAGNOSIS — Z87891 Personal history of nicotine dependence: Secondary | ICD-10-CM | POA: Insufficient documentation

## 2018-03-26 DIAGNOSIS — I10 Essential (primary) hypertension: Secondary | ICD-10-CM | POA: Diagnosis present

## 2018-03-26 DIAGNOSIS — I252 Old myocardial infarction: Secondary | ICD-10-CM | POA: Insufficient documentation

## 2018-03-26 LAB — CBC WITH DIFFERENTIAL/PLATELET
ABS IMMATURE GRANULOCYTES: 0.05 10*3/uL (ref 0.00–0.07)
BASOS PCT: 0 %
Basophils Absolute: 0.1 10*3/uL (ref 0.0–0.1)
EOS ABS: 0.3 10*3/uL (ref 0.0–0.5)
EOS PCT: 2 %
HCT: 41.4 % (ref 39.0–52.0)
HEMOGLOBIN: 13.8 g/dL (ref 13.0–17.0)
IMMATURE GRANULOCYTES: 0 %
LYMPHS ABS: 3 10*3/uL (ref 0.7–4.0)
Lymphocytes Relative: 21 %
MCH: 29.7 pg (ref 26.0–34.0)
MCHC: 33.3 g/dL (ref 30.0–36.0)
MCV: 89.2 fL (ref 80.0–100.0)
MONOS PCT: 7 %
Monocytes Absolute: 1 10*3/uL (ref 0.1–1.0)
NEUTROS PCT: 70 %
Neutro Abs: 10 10*3/uL — ABNORMAL HIGH (ref 1.7–7.7)
Platelets: 316 10*3/uL (ref 150–400)
RBC: 4.64 MIL/uL (ref 4.22–5.81)
RDW: 12 % (ref 11.5–15.5)
WBC: 14.4 10*3/uL — ABNORMAL HIGH (ref 4.0–10.5)
nRBC: 0 % (ref 0.0–0.2)

## 2018-03-26 LAB — CBG MONITORING, ED: Glucose-Capillary: 211 mg/dL — ABNORMAL HIGH (ref 70–99)

## 2018-03-26 LAB — URINALYSIS, ROUTINE W REFLEX MICROSCOPIC
BILIRUBIN URINE: NEGATIVE
Glucose, UA: NEGATIVE mg/dL
HGB URINE DIPSTICK: NEGATIVE
Ketones, ur: NEGATIVE mg/dL
Leukocytes,Ua: NEGATIVE
NITRITE: NEGATIVE
PH: 7 (ref 5.0–8.0)
Protein, ur: NEGATIVE mg/dL
SPECIFIC GRAVITY, URINE: 1.012 (ref 1.005–1.030)

## 2018-03-26 LAB — COMPREHENSIVE METABOLIC PANEL
ALBUMIN: 4.4 g/dL (ref 3.5–5.0)
ALK PHOS: 63 U/L (ref 38–126)
ALT: 23 U/L (ref 0–44)
AST: 26 U/L (ref 15–41)
Anion gap: 11 (ref 5–15)
BUN: 21 mg/dL — ABNORMAL HIGH (ref 6–20)
CALCIUM: 10.1 mg/dL (ref 8.9–10.3)
CO2: 24 mmol/L (ref 22–32)
CREATININE: 1.38 mg/dL — AB (ref 0.61–1.24)
Chloride: 98 mmol/L (ref 98–111)
GFR calc Af Amer: >60 mL/min (ref >60)
GFR calc non Af Amer: 57 mL/min — ABNORMAL LOW (ref >60)
GLUCOSE: 165 mg/dL — AB (ref 70–99)
Potassium: 3.9 mmol/L (ref 3.5–5.1)
Sodium: 133 mmol/L — ABNORMAL LOW (ref 135–145)
Total Bilirubin: 0.4 mg/dL (ref 0.3–1.2)
Total Protein: 7.2 g/dL (ref 6.5–8.1)

## 2018-03-26 LAB — INFLUENZA PANEL BY PCR (TYPE A & B)
INFLBPCR: NEGATIVE
Influenza A By PCR: NEGATIVE

## 2018-03-26 LAB — LIPASE, BLOOD: Lipase: 32 U/L (ref 11–51)

## 2018-03-26 MED ORDER — ACETAMINOPHEN 325 MG PO TABS
650.0000 mg | ORAL_TABLET | Freq: Once | ORAL | Status: AC
Start: 2018-03-26 — End: 2018-03-26
  Administered 2018-03-26: 650 mg via ORAL
  Filled 2018-03-26: qty 2

## 2018-03-26 MED ORDER — SODIUM CHLORIDE 0.9 % IV BOLUS
1000.0000 mL | Freq: Once | INTRAVENOUS | Status: AC
  Administered 2018-03-26: 1000 mL via INTRAVENOUS

## 2018-03-26 NOTE — ED Triage Notes (Signed)
Pt presents for eval of hypertension and hyperglycemia. Pt states he is pre-diabetic and his blood sugar has been over 200 all day, pt states his BP has been 160 systolic x2 days. Pt states he has been taking his BP medication as prescribed. Pt denies HA, NVD, CP, SOB. Pt endorses hx of MI in January of 2019. Pt A+Ox4, skin warm and dry.
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
## 2018-03-26 NOTE — Discharge Instructions (Signed)
You were evaluated in the emergency department for high blood sugar and high blood pressure.  You were found to have a fever here and had further testing with a chest x-ray and blood work and a urinalysis.  Your flu test was negative and your chest x-ray did not show any signs of pneumonia.  We do not have an obvious source of your fever yet so you will need to keep an eye out for further symptoms.  Please stay well-hydrated.  Follow-up with your doctor this week and return if any worsening symptoms.

## 2018-03-28 ENCOUNTER — Encounter: Payer: Self-pay | Admitting: Family Medicine

## 2018-03-28 ENCOUNTER — Ambulatory Visit (INDEPENDENT_AMBULATORY_CARE_PROVIDER_SITE_OTHER): Payer: No Typology Code available for payment source | Admitting: Family Medicine

## 2018-03-28 VITALS — BP 112/78 | HR 82 | Temp 98.5°F | Resp 18 | Ht 69.0 in | Wt 243.0 lb

## 2018-03-28 DIAGNOSIS — E038 Other specified hypothyroidism: Secondary | ICD-10-CM

## 2018-03-28 DIAGNOSIS — E118 Type 2 diabetes mellitus with unspecified complications: Secondary | ICD-10-CM | POA: Diagnosis not present

## 2018-03-28 DIAGNOSIS — D72829 Elevated white blood cell count, unspecified: Secondary | ICD-10-CM

## 2018-03-28 LAB — CBC WITH DIFFERENTIAL/PLATELET
Absolute Monocytes: 808 cells/uL (ref 200–950)
Basophils Absolute: 30 cells/uL (ref 0–200)
Basophils Relative: 0.3 %
Eosinophils Absolute: 434 cells/uL (ref 15–500)
Eosinophils Relative: 4.3 %
HEMATOCRIT: 38.8 % (ref 38.5–50.0)
Hemoglobin: 13 g/dL — ABNORMAL LOW (ref 13.2–17.1)
Lymphs Abs: 2949 cells/uL (ref 850–3900)
MCH: 30.2 pg (ref 27.0–33.0)
MCHC: 33.5 g/dL (ref 32.0–36.0)
MCV: 90 fL (ref 80.0–100.0)
MPV: 9.7 fL (ref 7.5–12.5)
Monocytes Relative: 8 %
Neutro Abs: 5878 cells/uL (ref 1500–7800)
Neutrophils Relative %: 58.2 %
Platelets: 330 10*3/uL (ref 140–400)
RBC: 4.31 10*6/uL (ref 4.20–5.80)
RDW: 11.8 % (ref 11.0–15.0)
Total Lymphocyte: 29.2 %
WBC: 10.1 10*3/uL (ref 3.8–10.8)

## 2018-03-28 MED ORDER — LEVOTHYROXINE SODIUM 50 MCG PO TABS: 50.0000 ug | ORAL_TABLET | Freq: Every day | ORAL | 3 refills | Status: AC

## 2018-03-28 MED ORDER — METFORMIN HCL 500 MG PO TABS: 1000.0000 mg | ORAL_TABLET | Freq: Two times a day (BID) | ORAL | 3 refills | Status: DC
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
## 2018-04-06 ENCOUNTER — Other Ambulatory Visit: Payer: Self-pay | Admitting: Family Medicine

## 2018-04-06 NOTE — Telephone Encounter (Signed)
Ok to refill??  Last office visit 03/28/2018.  Last refill 03/14/2018.

## 2018-04-19 ENCOUNTER — Other Ambulatory Visit: Payer: Self-pay

## 2018-04-19 ENCOUNTER — Encounter: Payer: Self-pay | Admitting: Family Medicine

## 2018-04-19 ENCOUNTER — Ambulatory Visit: Payer: No Typology Code available for payment source | Admitting: Family Medicine

## 2018-04-19 ENCOUNTER — Other Ambulatory Visit: Payer: Self-pay | Admitting: Family Medicine

## 2018-04-19 VITALS — BP 104/62 | HR 70 | Temp 98.6°F | Resp 16 | Ht 69.0 in | Wt 238.0 lb

## 2018-04-19 DIAGNOSIS — E118 Type 2 diabetes mellitus with unspecified complications: Secondary | ICD-10-CM | POA: Diagnosis not present

## 2018-04-19 DIAGNOSIS — M79671 Pain in right foot: Secondary | ICD-10-CM | POA: Diagnosis not present

## 2018-04-19 DIAGNOSIS — I2510 Atherosclerotic heart disease of native coronary artery without angina pectoris: Secondary | ICD-10-CM | POA: Diagnosis not present

## 2018-04-19 DIAGNOSIS — I2584 Coronary atherosclerosis due to calcified coronary lesion: Secondary | ICD-10-CM

## 2018-04-19 LAB — URIC ACID: Uric Acid, Serum: 5 mg/dL (ref 4.0–8.0)

## 2018-04-19 NOTE — Telephone Encounter (Signed)
Ok to refill??  Last office visit 03/28/2018.  Last refill 04/07/2018, #15 tabs.
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
## 2018-04-21 ENCOUNTER — Other Ambulatory Visit: Payer: Self-pay | Admitting: Cardiology

## 2018-04-21 ENCOUNTER — Other Ambulatory Visit: Payer: Self-pay | Admitting: Family Medicine

## 2018-04-21 MED ORDER — COLCHICINE 0.6 MG PO TABS: ORAL_TABLET | ORAL | 1 refills | Status: DC

## 2018-04-25 ENCOUNTER — Telehealth: Payer: Self-pay | Admitting: Family Medicine

## 2018-04-25 MED ORDER — GABAPENTIN 600 MG PO TABS: 600.0000 mg | ORAL_TABLET | Freq: Three times a day (TID) | ORAL | 5 refills | Status: DC

## 2018-04-25 NOTE — Telephone Encounter (Signed)
Pt called and states that the colchicine did not help at all and would like to increase his Gabapentin. Please advise increased dosage. (Does he stop the colchicine?)

## 2018-04-25 NOTE — Telephone Encounter (Signed)
Gabapentin 600 tid

## 2018-04-25 NOTE — Telephone Encounter (Signed)
Patient aware of providers recommendations and med sent to pharm 

## 2018-05-03 ENCOUNTER — Other Ambulatory Visit: Payer: Self-pay | Admitting: Family Medicine

## 2018-05-03 NOTE — Telephone Encounter (Signed)
Ok to refill??  Last office visit/ refill 04/19/2018 for #15 tabs.   Patient is requesting #30 tabs.

## 2018-06-01 ENCOUNTER — Other Ambulatory Visit: Payer: Self-pay | Admitting: Family Medicine

## 2018-06-01 NOTE — Telephone Encounter (Signed)
Ok to refill??  Last office visit 04/19/2018  Last refill 05/04/2018.

## 2018-06-30 ENCOUNTER — Ambulatory Visit: Payer: PRIVATE HEALTH INSURANCE | Admitting: Cardiology

## 2018-07-07 ENCOUNTER — Other Ambulatory Visit: Payer: Self-pay

## 2018-07-07 ENCOUNTER — Ambulatory Visit (INDEPENDENT_AMBULATORY_CARE_PROVIDER_SITE_OTHER): Payer: PRIVATE HEALTH INSURANCE | Admitting: Physician Assistant

## 2018-07-07 ENCOUNTER — Encounter: Payer: Self-pay | Admitting: Physician Assistant

## 2018-07-07 VITALS — BP 122/82 | HR 93 | Temp 98.5°F | Ht 71.0 in | Wt 241.0 lb

## 2018-07-07 DIAGNOSIS — E785 Hyperlipidemia, unspecified: Secondary | ICD-10-CM | POA: Diagnosis not present

## 2018-07-07 DIAGNOSIS — I2510 Atherosclerotic heart disease of native coronary artery without angina pectoris: Secondary | ICD-10-CM | POA: Diagnosis not present
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
## 2018-07-07 NOTE — Patient Instructions (Addendum)
Medication Instructions:  Your physician recommends that you continue on your current medications as directed. Please refer to the Current Medication list given to you today.  If you need a refill on your cardiac medications before your next appointment, please call your pharmacy.   Lab work: NONE   If you have labs (blood work) drawn today and your tests are completely normal, you will receive your results only by: Marland Kitchen. MyChart Message (if you have MyChart) OR . A paper copy in the mail If you have any lab test that is abnormal or we need to change your treatment, we will call you to review the results.  Testing/Procedures: NONE   Follow-Up: At [facility], you and your health needs are our priority.  As part of our continuing mission to provide you with exceptional heart care, we have created designated Provider Care Teams.  These Care Teams include your primary Cardiologist (physician) and Advanced Practice Providers (APPs -  Physician Assistants and Nurse Practitioners) who all work together to provide you with the care you need, when you need it. You will need a follow up appointment in 6 months.  Please call our office 2 months in advance to schedule this appointment.  You may see Dina RichBranch, Jonathan, MD or one of the following Advanced Practice Providers on your designated Care Team:   Randall AnBrittany Strader, PA-C [facility] Office) . Jacolyn ReedyMichele Lenze, PA-C [facility]([place] Office)  Any Other Special Instructions Will Be Listed Below (If Applicable).  Your physician discussed the importance of regular exercise and recommended that you start or continue a regular exercise program for good health.  Karalee HeightKristin Alvstadt, PharmD will be reaching out to you next week to get you started with the Lip[facility] Clinic at the [facility].  Walk daily starting at 10 minutes daily up to 30 minutes daily.  Thank you for choosing [facility]!   Diabetes Mellitus and Nutrition, Adult When you  have diabetes (diabetes mellitus), it is very important to have healthy eating habits because your blood sugar (glucose) levels are greatly affected by what you eat and drink. Eating healthy foods in the appropriate amounts, at about the same times every day, can help you:  Control your blood glucose.  Lower your risk of heart disease.  Improve your blood pressure.  Reach or maintain a healthy weight. Every person with diabetes is different, and each person has different needs for a meal plan. Your health care provider may recommend that you work with a diet and nutrition specialist (dietitian) to make a meal plan that is best for you. Your meal plan may vary depending on factors such as:  The calories you need.  The medicines you take.  Your weight.  Your blood glucose, blood pressure, and cholesterol levels.  Your activity level.  Other health conditions you have, such as heart or kidney disease. How do carbohydrates affect me? Carbohydrates, also called carbs, affect your blood glucose level more than any other type of food. Eating carbs naturally raises the amount of glucose in your blood. Carb counting is a method for keeping track of how many carbs you eat. Counting carbs is important to keep your blood glucose at a healthy level, especially if you use insulin or take certain oral diabetes medicines. It is important to know how many carbs you can safely have in each meal. This is different for every person. Your dietitian can help you calculate how many carbs you should have at each meal and for each  snack. Foods that contain carbs include:  Bread, cereal, rice, pasta, and crackers.  Potatoes and corn.  Peas, beans, and lentils.  Milk and yogurt.  Fruit and juice.  Desserts, such as cakes, cookies, ice cream, and candy. How does alcohol affect me? Alcohol can cause a sudden decrease in blood glucose (hypoglycemia), especially if you use insulin or take certain oral  diabetes medicines. Hypoglycemia can be a life-threatening condition. Symptoms of hypoglycemia (sleepiness, dizziness, and confusion) are similar to symptoms of having too much alcohol. If your health care provider says that alcohol is safe for you, follow these guidelines:  Limit alcohol intake to no more than 1 drink per day for nonpregnant women and 2 drinks per day for men. One drink equals 12 oz of beer, 5 oz of wine, or 1 oz of hard liquor.  Do not drink on an empty stomach.  Keep yourself hydrated with water, diet soda, or unsweetened iced tea.  Keep in mind that regular soda, juice, and other mixers may contain a lot of sugar and must be counted as carbs. What are tips for following this plan?  Reading food labels  Start by checking the serving size on the "Nutrition Facts" label of packaged foods and drinks. The amount of calories, carbs, fats, and other nutrients listed on the label is based on one serving of the item. Many items contain more than one serving per package.  Check the total grams (g) of carbs in one serving. You can calculate the number of servings of carbs in one serving by dividing the total carbs by 15. For example, if a food has 30 g of total carbs, it would be equal to 2 servings of carbs.  Check the number of grams (g) of saturated and trans fats in one serving. Choose foods that have low or no amount of these fats.  Check the number of milligrams (mg) of salt (sodium) in one serving. Most people should limit total sodium intake to less than 2,300 mg per day.  Always check the nutrition information of foods labeled as "low-fat" or "nonfat". These foods may be higher in added sugar or refined carbs and should be avoided.  Talk to your dietitian to identify your daily goals for nutrients listed on the label. Shopping  Avoid buying canned, premade, or processed foods. These foods tend to be high in fat, sodium, and added sugar.  Shop around the outside edge  of the grocery store. This includes fresh fruits and vegetables, bulk grains, fresh meats, and fresh dairy. Cooking  Use low-heat cooking methods, such as baking, instead of high-heat cooking methods like deep frying.  Cook using healthy oils, such as olive, canola, or sunflower oil.  Avoid cooking with butter, cream, or high-fat meats. Meal planning  Eat meals and snacks regularly, preferably at the same times every day. Avoid going long periods of time without eating.  Eat foods high in fiber, such as fresh fruits, vegetables, beans, and whole grains. Talk to your dietitian about how many servings of carbs you can eat at each meal.  Eat 4-6 ounces (oz) of lean protein each day, such as lean meat, chicken, fish, eggs, or tofu. One oz of lean protein is equal to: ? 1 oz of meat, chicken, or fish. ? 1 egg. ?  cup of tofu.  Eat some foods each day that contain healthy fats, such as avocado, nuts, seeds, and fish. Lifestyle  Check your blood glucose regularly.  Exercise regularly as told  by your health care provider. This may include: ? 150 minutes of moderate-intensity or vigorous-intensity exercise each week. This could be brisk walking, biking, or water aerobics. ? Stretching and doing strength exercises, such as yoga or weightlifting, at least 2 times a week.  Take medicines as told by your health care provider.  Do not use any products that contain nicotine or tobacco, such as cigarettes and e-cigarettes. If you need help quitting, ask your health care provider.  Work with a Social worker or diabetes educator to identify strategies to manage stress and any emotional and social challenges. Questions to ask a health care provider  Do I need to meet with a diabetes educator?  Do I need to meet with a dietitian?  What number can I call if I have questions?  When are the best times to check my blood glucose? Where to find more information:  American Diabetes Association:  diabetes.org  Academy of Nutrition and Dietetics: www.eatright.[employer] of Diabetes and Digestive and Kidney Diseases (NIH): [employer] Summary  A healthy meal plan will help you control your blood glucose and maintain a healthy lifestyle.  Working with a diet and nutrition specialist (dietitian) can help you make a meal plan that is best for you.  Keep in mind that carbohydrates (carbs) and alcohol have immediate effects on your blood glucose levels. It is important to count carbs and to use alcohol carefully. This information is not intended to replace advice given to you by your health care provider. Make sure you discuss any questions you have with your health care provider. Document Released: 10/08/2004 Document Revised: 08/11/2016 Document Reviewed: 02/16/2016 Elsevier Interactive Patient Education  2019 [employer].

## 2018-08-05 ENCOUNTER — Other Ambulatory Visit: Payer: Self-pay | Admitting: Family Medicine

## 2018-08-07 ENCOUNTER — Other Ambulatory Visit: Payer: Self-pay

## 2018-08-07 DIAGNOSIS — Z20822 Contact with and (suspected) exposure to covid-19: Secondary | ICD-10-CM

## 2018-08-07 NOTE — Telephone Encounter (Signed)
Ok to refill??  Last office visit 04/19/2018.  Last refill 06/01/2018, #1 refill.

## 2018-08-11 LAB — NOVEL CORONAVIRUS, NAA: SARS-CoV-2, NAA: NOT DETECTED

## 2018-08-24 ENCOUNTER — Other Ambulatory Visit: Payer: Self-pay | Admitting: Cardiology

## 2018-08-29 ENCOUNTER — Other Ambulatory Visit: Payer: Self-pay | Admitting: Pharmacist Clinician (PhC)/ Clinical Pharmacy Specialist

## 2018-08-29 MED ORDER — PRALUENT 150 MG/ML ~~LOC~~ SOAJ: 150.0000 mg | SUBCUTANEOUS | 12 refills | Status: DC

## 2018-08-29 MED ORDER — REPATHA SURECLICK 140 MG/ML ~~LOC~~ SOAJ: 140.0000 mg | SUBCUTANEOUS | 12 refills | Status: DC

## 2018-08-29 NOTE — Telephone Encounter (Signed)
Insurance does not cover Repatha, will not even run a PA, as it will reject once the drug name is entered.   Sent rx to local pharmacy for both [place] and Praluent.  Will try to get free from either manufacturer with denial letter.

## 2018-09-18 ENCOUNTER — Telehealth: Payer: Self-pay | Admitting: Cardiology

## 2018-09-18 NOTE — Telephone Encounter (Addendum)
° °  Cover My Meds calling [phone], regarding pre auth for Repatha. Use ref # [id number]  Pt c/o medication issue:  1. Name of Medication: Evolocumab (REPATHA SURECLICK) 629 MG/ML SOAJ  2. How are you currently taking this medication (dosage and times per day)? As written  3. Are you having a reaction (difficulty breathing--STAT)? no  4. What is your medication issue? Pre Josem Kaufmann

## 2018-09-19 NOTE — Telephone Encounter (Signed)
Another Prior auth to be faxed to the office by cover my meds.

## 2018-10-05 ENCOUNTER — Telehealth: Payer: Self-pay

## 2018-10-05 NOTE — Telephone Encounter (Signed)
Called and lmomed the pt regarding the pt assistance forms to see if they have received them and had the chance to fill them out and mail them back to [place] so that we can proceed in our part of the process

## 2018-10-16 ENCOUNTER — Other Ambulatory Visit: Payer: Self-pay | Admitting: Family Medicine

## 2018-10-16 DIAGNOSIS — E118 Type 2 diabetes mellitus with unspecified complications: Secondary | ICD-10-CM

## 2018-10-16 NOTE — Telephone Encounter (Signed)
Ok to refill??  Last office visit 04/19/2018.  Last refill  08/07/2018, #1 refill.

## 2018-11-01 ENCOUNTER — Telehealth: Payer: Self-pay

## 2018-11-01 NOTE — Telephone Encounter (Signed)
Made a 2nd attempt to contact in regards to obtaining the snf forms

## 2018-11-06 ENCOUNTER — Emergency Department (HOSPITAL_COMMUNITY)
Admission: EM | Admit: 2018-11-06 | Discharge: 2018-11-06 | Disposition: A | Discharge status: Home / Self Care | Payer: PRIVATE HEALTH INSURANCE | Attending: Emergency Medicine | Admitting: Emergency Medicine

## 2018-11-06 ENCOUNTER — Encounter (HOSPITAL_COMMUNITY): Payer: Self-pay | Admitting: Emergency Medicine

## 2018-11-06 DIAGNOSIS — Z5321 Procedure and treatment not carried out due to patient leaving prior to being seen by health care provider: Secondary | ICD-10-CM | POA: Diagnosis not present

## 2018-11-06 DIAGNOSIS — R739 Hyperglycemia, unspecified: Secondary | ICD-10-CM | POA: Insufficient documentation

## 2018-11-06 LAB — CBC
HCT: 41.9 % (ref 39.0–52.0)
Hemoglobin: 13.8 g/dL (ref 13.0–17.0)
MCH: 29.7 pg (ref 26.0–34.0)
MCHC: 32.9 g/dL (ref 30.0–36.0)
MCV: 90.1 fL (ref 80.0–100.0)
Platelets: 353 10*3/uL (ref 150–400)
RBC: 4.65 MIL/uL (ref 4.22–5.81)
RDW: 12.6 % (ref 11.5–15.5)
WBC: 12.3 10*3/uL — ABNORMAL HIGH (ref 4.0–10.5)
nRBC: 0 % (ref 0.0–0.2)

## 2018-11-06 LAB — URINALYSIS, ROUTINE W REFLEX MICROSCOPIC
Bilirubin Urine: NEGATIVE
Glucose, UA: NEGATIVE mg/dL
Hgb urine dipstick: NEGATIVE
Ketones, ur: NEGATIVE mg/dL
Leukocytes,Ua: NEGATIVE
Nitrite: NEGATIVE
Protein, ur: NEGATIVE mg/dL
Specific Gravity, Urine: 1.01 (ref 1.005–1.030)
pH: 5 (ref 5.0–8.0)

## 2018-11-06 LAB — BASIC METABOLIC PANEL
Anion gap: 11 (ref 5–15)
BUN: 18 mg/dL (ref 6–20)
CO2: 24 mmol/L (ref 22–32)
Calcium: 10.1 mg/dL (ref 8.9–10.3)
Chloride: 102 mmol/L (ref 98–111)
Creatinine, Ser: 1.12 mg/dL (ref 0.61–1.24)
GFR calc Af Amer: >60 mL/min (ref >60)
GFR calc non Af Amer: >60 mL/min (ref >60)
Glucose, Bld: 146 mg/dL — ABNORMAL HIGH (ref 70–99)
Potassium: 4.4 mmol/L (ref 3.5–5.1)
Sodium: 137 mmol/L (ref 135–145)

## 2018-11-06 NOTE — ED Triage Notes (Signed)
Pt reports blood sugars in the 200s this morning. Reports sensation of SOB. Denies recent fever. VSS. NAD at present.

## 2018-11-14 ENCOUNTER — Other Ambulatory Visit: Payer: Self-pay | Admitting: Family Medicine

## 2018-11-14 ENCOUNTER — Other Ambulatory Visit: Payer: Self-pay | Admitting: Cardiology

## 2018-11-27 ENCOUNTER — Telehealth: Payer: Self-pay

## 2018-11-27 NOTE — Telephone Encounter (Signed)
Called and lmomed the pt regarding missing info on the [employer] pt assistance form for the repatha. Awaiting a callback

## 2018-12-13 ENCOUNTER — Other Ambulatory Visit: Payer: Self-pay | Admitting: Family Medicine

## 2018-12-13 NOTE — Telephone Encounter (Signed)
Last refilled: 10/17/2018 Last office visit: 04/19/2018

## 2018-12-20 ENCOUNTER — Other Ambulatory Visit: Payer: Self-pay | Admitting: Family Medicine

## 2019-01-11 ENCOUNTER — Other Ambulatory Visit: Payer: Self-pay | Admitting: Family Medicine

## 2019-01-12 NOTE — Telephone Encounter (Signed)
Requesting refill    Ambien  LOV: 04/19/18  LRF:  12/14/18

## 2019-02-08 ENCOUNTER — Telehealth: Payer: Self-pay | Admitting: Pharmacist Clinician (PhC)/ Clinical Pharmacy Specialist

## 2019-02-08 NOTE — Telephone Encounter (Signed)
LMOM - have been trying to get coverage for Repatha for several months.  [employer] insurance plan will not cover "injectable medications".  Thus they would not give a denial letter.  Because we could not get denial, cannot get covered at no cost thru [employer] either.    Can consider Nexlezet (he's already on atorvastatin and ezetimibe).  If his insurance has changed for 2021 can consider PA for PCSK-9 inhibitor as well.

## 2019-02-15 NOTE — Telephone Encounter (Signed)
LMOM - pt to call back if he wants to consider Nexletol or has new insurance

## 2019-03-09 DIAGNOSIS — E039 Hypothyroidism, unspecified: Secondary | ICD-10-CM | POA: Diagnosis not present

## 2019-03-09 DIAGNOSIS — E291 Testicular hypofunction: Secondary | ICD-10-CM | POA: Diagnosis not present

## 2019-03-09 DIAGNOSIS — E559 Vitamin D deficiency, unspecified: Secondary | ICD-10-CM | POA: Diagnosis not present

## 2019-03-09 DIAGNOSIS — Z13220 Encounter for screening for lipoid disorders: Secondary | ICD-10-CM | POA: Diagnosis not present

## 2019-03-09 DIAGNOSIS — M129 Arthropathy, unspecified: Secondary | ICD-10-CM | POA: Diagnosis not present

## 2019-03-09 DIAGNOSIS — E119 Type 2 diabetes mellitus without complications: Secondary | ICD-10-CM | POA: Diagnosis not present

## 2019-03-09 DIAGNOSIS — R0602 Shortness of breath: Secondary | ICD-10-CM | POA: Diagnosis not present

## 2019-03-09 DIAGNOSIS — Z0000 Encounter for general adult medical examination without abnormal findings: Secondary | ICD-10-CM | POA: Diagnosis not present

## 2019-03-09 DIAGNOSIS — Z8042 Family history of malignant neoplasm of prostate: Secondary | ICD-10-CM | POA: Diagnosis not present

## 2019-03-09 DIAGNOSIS — Z20822 Contact with and (suspected) exposure to covid-19: Secondary | ICD-10-CM | POA: Diagnosis not present

## 2019-03-21 ENCOUNTER — Other Ambulatory Visit: Payer: Self-pay | Admitting: Cardiology

## 2019-03-21 DIAGNOSIS — E7800 Pure hypercholesterolemia, unspecified: Secondary | ICD-10-CM | POA: Diagnosis not present

## 2019-03-21 DIAGNOSIS — E039 Hypothyroidism, unspecified: Secondary | ICD-10-CM | POA: Diagnosis not present

## 2019-03-21 DIAGNOSIS — E559 Vitamin D deficiency, unspecified: Secondary | ICD-10-CM | POA: Diagnosis not present

## 2019-03-21 DIAGNOSIS — E119 Type 2 diabetes mellitus without complications: Secondary | ICD-10-CM | POA: Diagnosis not present

## 2019-03-21 DIAGNOSIS — G629 Polyneuropathy, unspecified: Secondary | ICD-10-CM | POA: Diagnosis not present

## 2019-04-01 ENCOUNTER — Encounter (HOSPITAL_COMMUNITY): Payer: Self-pay | Admitting: *Deleted

## 2019-04-01 ENCOUNTER — Emergency Department (HOSPITAL_COMMUNITY)
Admission: EM | Admit: 2019-04-01 | Discharge: 2019-04-01 | Disposition: A | Discharge status: Home / Self Care | Payer: BC Managed Care – PPO | Attending: Emergency Medicine | Admitting: Emergency Medicine

## 2019-04-01 ENCOUNTER — Other Ambulatory Visit: Payer: Self-pay

## 2019-04-01 ENCOUNTER — Emergency Department (HOSPITAL_COMMUNITY): Payer: BC Managed Care – PPO

## 2019-04-01 DIAGNOSIS — Z79899 Other long term (current) drug therapy: Secondary | ICD-10-CM | POA: Insufficient documentation

## 2019-04-01 DIAGNOSIS — I119 Hypertensive heart disease without heart failure: Secondary | ICD-10-CM | POA: Insufficient documentation

## 2019-04-01 DIAGNOSIS — E119 Type 2 diabetes mellitus without complications: Secondary | ICD-10-CM | POA: Insufficient documentation

## 2019-04-01 DIAGNOSIS — Z9861 Coronary angioplasty status: Secondary | ICD-10-CM | POA: Insufficient documentation

## 2019-04-01 DIAGNOSIS — Z7984 Long term (current) use of oral hypoglycemic drugs: Secondary | ICD-10-CM | POA: Diagnosis not present

## 2019-04-01 DIAGNOSIS — I2510 Atherosclerotic heart disease of native coronary artery without angina pectoris: Secondary | ICD-10-CM | POA: Diagnosis not present

## 2019-04-01 DIAGNOSIS — N200 Calculus of kidney: Secondary | ICD-10-CM | POA: Insufficient documentation

## 2019-04-01 DIAGNOSIS — R1031 Right lower quadrant pain: Secondary | ICD-10-CM | POA: Diagnosis not present

## 2019-04-01 HISTORY — DX: Type 2 diabetes mellitus without complications: E11.9

## 2019-04-01 LAB — CBC WITH DIFFERENTIAL/PLATELET
Abs Immature Granulocytes: 0.03 10*3/uL (ref 0.00–0.07)
Basophils Absolute: 0.1 10*3/uL (ref 0.0–0.1)
Basophils Relative: 1 %
Eosinophils Absolute: 0.4 10*3/uL (ref 0.0–0.5)
Eosinophils Relative: 4 %
HCT: 42.5 % (ref 39.0–52.0)
Hemoglobin: 14.1 g/dL (ref 13.0–17.0)
Immature Granulocytes: 0 %
Lymphocytes Relative: 18 %
Lymphs Abs: 1.8 10*3/uL (ref 0.7–4.0)
MCH: 29.8 pg (ref 26.0–34.0)
MCHC: 33.2 g/dL (ref 30.0–36.0)
MCV: 89.9 fL (ref 80.0–100.0)
Monocytes Absolute: 0.6 10*3/uL (ref 0.1–1.0)
Monocytes Relative: 6 %
Neutro Abs: 7 10*3/uL (ref 1.7–7.7)
Neutrophils Relative %: 71 %
Platelets: 351 10*3/uL (ref 150–400)
RBC: 4.73 MIL/uL (ref 4.22–5.81)
RDW: 12.2 % (ref 11.5–15.5)
WBC: 9.8 10*3/uL (ref 4.0–10.5)
nRBC: 0 % (ref 0.0–0.2)

## 2019-04-01 LAB — URINALYSIS, ROUTINE W REFLEX MICROSCOPIC
Bacteria, UA: NONE SEEN
Bilirubin Urine: NEGATIVE
Glucose, UA: NEGATIVE mg/dL
Ketones, ur: NEGATIVE mg/dL
Leukocytes,Ua: NEGATIVE
Nitrite: NEGATIVE
Protein, ur: NEGATIVE mg/dL
RBC / HPF: >50 RBC/hpf — ABNORMAL HIGH (ref 0–5)
Specific Gravity, Urine: 1.015 (ref 1.005–1.030)
pH: 5 (ref 5.0–8.0)

## 2019-04-01 LAB — BASIC METABOLIC PANEL
Anion gap: 10 (ref 5–15)
BUN: 19 mg/dL (ref 6–20)
CO2: 23 mmol/L (ref 22–32)
Calcium: 10 mg/dL (ref 8.9–10.3)
Chloride: 102 mmol/L (ref 98–111)
Creatinine, Ser: 1.3 mg/dL — ABNORMAL HIGH (ref 0.61–1.24)
GFR calc Af Amer: >60 mL/min (ref >60)
GFR calc non Af Amer: >60 mL/min (ref >60)
Glucose, Bld: 172 mg/dL — ABNORMAL HIGH (ref 70–99)
Potassium: 4.5 mmol/L (ref 3.5–5.1)
Sodium: 135 mmol/L (ref 135–145)

## 2019-04-01 MED ORDER — OXYCODONE-ACETAMINOPHEN 5-325 MG PO TABS: 1.0000 | ORAL_TABLET | Freq: Four times a day (QID) | ORAL | 0 refills | Status: DC | PRN

## 2019-04-01 MED ORDER — ONDANSETRON HCL 4 MG/2ML IJ SOLN
4.0000 mg | Freq: Once | INTRAMUSCULAR | Status: AC
  Administered 2019-04-01: 4 mg via INTRAVENOUS
  Filled 2019-04-01: qty 2

## 2019-04-01 MED ORDER — TAMSULOSIN HCL 0.4 MG PO CAPS: 0.4000 mg | ORAL_CAPSULE | Freq: Every day | ORAL | 0 refills | Status: DC

## 2019-04-01 MED ORDER — KETOROLAC TROMETHAMINE 15 MG/ML IJ SOLN
15.0000 mg | Freq: Once | INTRAMUSCULAR | Status: AC
  Administered 2019-04-01: 15 mg via INTRAVENOUS
  Filled 2019-04-01: qty 1

## 2019-04-01 MED ORDER — HYDROMORPHONE HCL 1 MG/ML IJ SOLN
1.0000 mg | Freq: Once | INTRAMUSCULAR | Status: AC
  Administered 2019-04-01: 1 mg via INTRAVENOUS
  Filled 2019-04-01: qty 1
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
## 2019-04-01 NOTE — ED Triage Notes (Signed)
Pain in rt flank pain woke pt out of sleep. History of kidney stones.

## 2019-04-01 NOTE — Discharge Instructions (Signed)
You may want to take pain medicine fairly regularly for the next 12 hours just so the pain does not come back and become uncontrollable.  However if the stone has not passed in the next few days you should call urology for follow-up.  If you develop severe pain that does not improve with medication, intractable vomiting or a fever you should return to the emergency room.

## 2019-04-20 DIAGNOSIS — D485 Neoplasm of uncertain behavior of skin: Secondary | ICD-10-CM | POA: Diagnosis not present

## 2019-05-04 DIAGNOSIS — Z1211 Encounter for screening for malignant neoplasm of colon: Secondary | ICD-10-CM | POA: Diagnosis not present

## 2019-05-04 DIAGNOSIS — Z01818 Encounter for other preprocedural examination: Secondary | ICD-10-CM | POA: Diagnosis not present

## 2019-05-04 DIAGNOSIS — K625 Hemorrhage of anus and rectum: Secondary | ICD-10-CM | POA: Diagnosis not present

## 2019-05-04 DIAGNOSIS — Z800 Family history of malignant neoplasm of digestive organs: Secondary | ICD-10-CM | POA: Diagnosis not present

## 2019-05-17 DIAGNOSIS — Z1211 Encounter for screening for malignant neoplasm of colon: Secondary | ICD-10-CM | POA: Diagnosis not present

## 2019-05-17 DIAGNOSIS — Z01818 Encounter for other preprocedural examination: Secondary | ICD-10-CM | POA: Diagnosis not present

## 2019-05-17 DIAGNOSIS — K625 Hemorrhage of anus and rectum: Secondary | ICD-10-CM | POA: Diagnosis not present

## 2019-05-17 DIAGNOSIS — E119 Type 2 diabetes mellitus without complications: Secondary | ICD-10-CM | POA: Diagnosis not present

## 2019-05-18 DIAGNOSIS — C44319 Basal cell carcinoma of skin of other parts of face: Secondary | ICD-10-CM | POA: Diagnosis not present

## 2019-05-18 DIAGNOSIS — C4359 Malignant melanoma of other part of trunk: Secondary | ICD-10-CM | POA: Diagnosis not present

## 2019-05-25 DIAGNOSIS — K635 Polyp of colon: Secondary | ICD-10-CM | POA: Diagnosis not present

## 2019-05-25 DIAGNOSIS — C4359 Malignant melanoma of other part of trunk: Secondary | ICD-10-CM | POA: Diagnosis not present

## 2019-05-25 DIAGNOSIS — K529 Noninfective gastroenteritis and colitis, unspecified: Secondary | ICD-10-CM | POA: Diagnosis not present

## 2019-05-28 DIAGNOSIS — C4359 Malignant melanoma of other part of trunk: Secondary | ICD-10-CM | POA: Diagnosis not present

## 2019-05-28 DIAGNOSIS — C44319 Basal cell carcinoma of skin of other parts of face: Secondary | ICD-10-CM | POA: Diagnosis not present

## 2019-05-28 DIAGNOSIS — C4491 Basal cell carcinoma of skin, unspecified: Secondary | ICD-10-CM | POA: Diagnosis not present

## 2019-06-21 DIAGNOSIS — C4359 Malignant melanoma of other part of trunk: Secondary | ICD-10-CM | POA: Diagnosis not present

## 2019-06-29 DIAGNOSIS — D229 Melanocytic nevi, unspecified: Secondary | ICD-10-CM | POA: Diagnosis not present

## 2019-06-29 DIAGNOSIS — C4359 Malignant melanoma of other part of trunk: Secondary | ICD-10-CM | POA: Diagnosis not present

## 2019-07-18 ENCOUNTER — Other Ambulatory Visit: Payer: Self-pay | Admitting: Cardiology

## 2019-08-12 ENCOUNTER — Other Ambulatory Visit: Payer: Self-pay | Admitting: Cardiology
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
## 2019-08-22 ENCOUNTER — Other Ambulatory Visit: Payer: Self-pay

## 2019-08-22 MED ORDER — LOSARTAN POTASSIUM 50 MG PO TABS: 50.0000 mg | ORAL_TABLET | Freq: Every day | ORAL | 3 refills | Status: DC

## 2019-08-22 NOTE — Telephone Encounter (Signed)
Refilled losartan, pt has apt 08/27/19

## 2019-08-24 DIAGNOSIS — G629 Polyneuropathy, unspecified: Secondary | ICD-10-CM | POA: Diagnosis not present

## 2019-08-24 DIAGNOSIS — E559 Vitamin D deficiency, unspecified: Secondary | ICD-10-CM | POA: Diagnosis not present

## 2019-08-24 DIAGNOSIS — E7800 Pure hypercholesterolemia, unspecified: Secondary | ICD-10-CM | POA: Diagnosis not present

## 2019-08-24 DIAGNOSIS — E119 Type 2 diabetes mellitus without complications: Secondary | ICD-10-CM | POA: Diagnosis not present

## 2019-08-24 DIAGNOSIS — E039 Hypothyroidism, unspecified: Secondary | ICD-10-CM | POA: Diagnosis not present

## 2019-08-24 DIAGNOSIS — Z79899 Other long term (current) drug therapy: Secondary | ICD-10-CM | POA: Diagnosis not present

## 2019-08-24 DIAGNOSIS — E291 Testicular hypofunction: Secondary | ICD-10-CM | POA: Diagnosis not present

## 2019-08-27 ENCOUNTER — Ambulatory Visit (INDEPENDENT_AMBULATORY_CARE_PROVIDER_SITE_OTHER): Payer: BC Managed Care – PPO | Admitting: Physician Assistant

## 2019-08-27 ENCOUNTER — Encounter: Payer: Self-pay | Admitting: Physician Assistant

## 2019-08-27 ENCOUNTER — Other Ambulatory Visit: Payer: Self-pay

## 2019-08-27 VITALS — BP 118/84 | HR 100 | Ht 70.0 in | Wt 235.0 lb

## 2019-08-27 DIAGNOSIS — I10 Essential (primary) hypertension: Secondary | ICD-10-CM | POA: Diagnosis not present

## 2019-08-27 DIAGNOSIS — R079 Chest pain, unspecified: Secondary | ICD-10-CM

## 2019-08-27 DIAGNOSIS — R072 Precordial pain: Secondary | ICD-10-CM | POA: Diagnosis not present

## 2019-08-27 DIAGNOSIS — I2510 Atherosclerotic heart disease of native coronary artery without angina pectoris: Secondary | ICD-10-CM

## 2019-08-27 DIAGNOSIS — E785 Hyperlipidemia, unspecified: Secondary | ICD-10-CM | POA: Diagnosis not present

## 2019-08-27 MED ORDER — METOPROLOL TARTRATE 25 MG PO TABS: 25.0000 mg | ORAL_TABLET | Freq: Two times a day (BID) | ORAL | 3 refills | Status: DC

## 2019-08-27 NOTE — Patient Instructions (Signed)
Medication Instructions:  TAKE LOPRESSOR 25 MG TWICE A DAY  *If you need a refill on your cardiac medications before your next appointment, please call your pharmacy*   Lab Work: NONE TODAY If you have labs (blood work) drawn today and your tests are completely normal, you will receive your results only by: Marland Kitchen MyChart Message (if you have MyChart) OR . A paper copy in the mail If you have any lab test that is abnormal or we need to change your treatment, we will call you to review the results.   Testing/Procedures: Your physician has requested that you have en exercise stress myoview. For further information please visit [email or web address] Please follow instruction sheet, as given.    Follow-Up: At [facility], you and your health needs are our priority.  As part of our continuing mission to provide you with exceptional heart care, we have created designated Provider Care Teams.  These Care Teams include your primary Cardiologist (physician) and Advanced Practice Providers (APPs -  Physician Assistants and Nurse Practitioners) who all work together to provide you with the care you need, when you need it.  We recommend signing up for the patient portal called "MyChart".  Sign up information is provided on this After Visit Summary.  MyChart is used to connect with patients for Virtual Visits (Telemedicine).  Patients are able to view lab/test results, encounter notes, upcoming appointments, etc.  Non-urgent messages can be sent to your provider as well.   To learn more about what you can do with MyChart, go to [email or web address].    Your next appointment:   3-4 month(s)  The format for your next appointment:   In Person  Provider:   Dina Rich, MD   Other Instructions    Follow up with Jacolyn Reedy, PA-C      Thank you for choosing [facility] !

## 2019-09-07 IMAGING — DX DG CHEST 1V PORT
1 series · 1 of 1 positions shown · non-contrast
Comparison: 03/17/2005.

CLINICAL DATA: Code STEMI.

EXAM:
PORTABLE CHEST 1 VIEW

[chest]
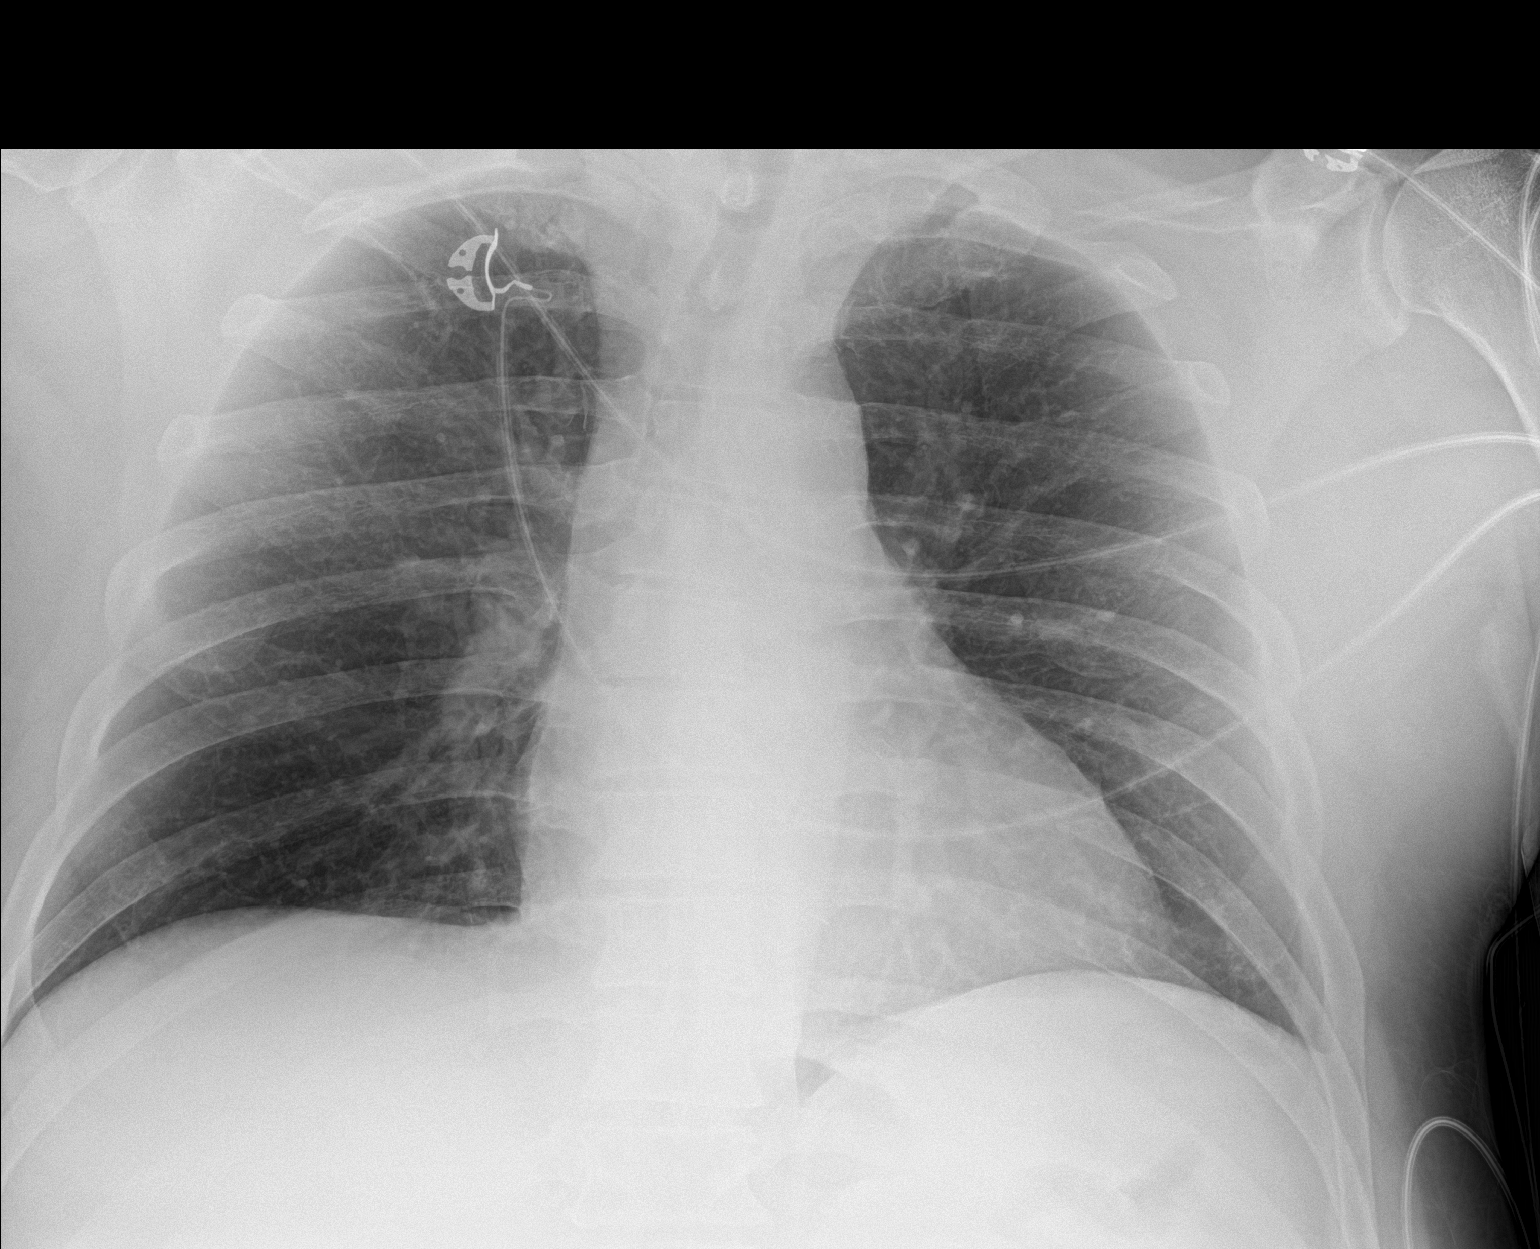

[1 of 1 positions shown; findings below may reference images not displayed]

FINDINGS: Mediastinum and hilar structures normal. No focal infiltrate. Right
costophrenic angle not completely imaged. Tiny left pleural effusion
cannot be excluded. Borderline cardiomegaly. No pulmonary venous
congestion. No acute bony abnormality.
IMPRESSION: 1. Right costophrenic angle not imaged. Tiny left pleural effusion
cannot be excluded.

2.  Borderline cardiomegaly.  No pulmonary venous congestion.

## 2019-09-10 ENCOUNTER — Other Ambulatory Visit: Payer: Self-pay

## 2019-09-10 ENCOUNTER — Other Ambulatory Visit (HOSPITAL_COMMUNITY)
Admission: RE | Admit: 2019-09-10 | Discharge: 2019-09-10 | Disposition: A | Discharge status: Home / Self Care | Payer: BC Managed Care – PPO | Source: Ambulatory Visit | Attending: Physician Assistant | Admitting: Physician Assistant

## 2019-09-10 DIAGNOSIS — Z01812 Encounter for preprocedural laboratory examination: Secondary | ICD-10-CM | POA: Insufficient documentation

## 2019-09-10 DIAGNOSIS — Z20822 Contact with and (suspected) exposure to covid-19: Secondary | ICD-10-CM | POA: Diagnosis not present

## 2019-09-11 LAB — SARS CORONAVIRUS 2 (TAT 6-24 HRS): SARS Coronavirus 2: NEGATIVE

## 2019-09-12 ENCOUNTER — Encounter (HOSPITAL_BASED_OUTPATIENT_CLINIC_OR_DEPARTMENT_OTHER)
Admission: RE | Admit: 2019-09-12 | Discharge: 2019-09-12 | Disposition: A | Discharge status: Home / Self Care | Payer: BC Managed Care – PPO | Source: Ambulatory Visit | Attending: Physician Assistant | Admitting: Physician Assistant

## 2019-09-12 ENCOUNTER — Encounter (HOSPITAL_COMMUNITY)
Admission: RE | Admit: 2019-09-12 | Discharge: 2019-09-12 | Disposition: A | Discharge status: Home / Self Care | Payer: BC Managed Care – PPO | Source: Ambulatory Visit | Attending: Physician Assistant | Admitting: Physician Assistant

## 2019-09-12 ENCOUNTER — Other Ambulatory Visit: Payer: Self-pay

## 2019-09-12 ENCOUNTER — Encounter (HOSPITAL_COMMUNITY): Payer: Self-pay

## 2019-09-12 DIAGNOSIS — R079 Chest pain, unspecified: Secondary | ICD-10-CM

## 2019-09-12 HISTORY — DX: Disorder of kidney and ureter, unspecified: N28.9

## 2019-09-12 HISTORY — DX: Essential (primary) hypertension: I10

## 2019-09-12 LAB — NM MYOCAR MULTI W/SPECT W/WALL MOTION / EF
Estimated workload: 9.6 METS
Exercise duration (min): 7 min
Exercise duration (sec): 24 s
LV dias vol: 95 mL (ref 62–150)
LV sys vol: 38 mL
MPHR: 163 {beats}/min
Peak HR: 139 {beats}/min
Percent HR: 85 %
RATE: 0.38
RPE: 13
Rest HR: 77 {beats}/min
SDS: 0
SRS: 0
SSS: 0
TID: 1.06

## 2019-09-12 MED ORDER — REGADENOSON 0.4 MG/5ML IV SOLN
INTRAVENOUS | Status: AC
  Filled 2019-09-12: qty 5

## 2019-09-12 MED ORDER — SODIUM CHLORIDE FLUSH 0.9 % IV SOLN
INTRAVENOUS | Status: AC
  Administered 2019-09-12: 10 mL via INTRAVENOUS
  Filled 2019-09-12: qty 10

## 2019-09-12 MED ORDER — TECHNETIUM TC 99M TETROFOSMIN IV KIT
10.0000 | PACK | Freq: Once | INTRAVENOUS | Status: AC | PRN
  Administered 2019-09-12: 11 via INTRAVENOUS

## 2019-09-12 MED ORDER — TECHNETIUM TC 99M TETROFOSMIN IV KIT
30.0000 | PACK | Freq: Once | INTRAVENOUS | Status: AC | PRN
  Administered 2019-09-12: 30 via INTRAVENOUS

## 2019-10-22 ENCOUNTER — Ambulatory Visit: Payer: PRIVATE HEALTH INSURANCE | Admitting: Physician Assistant

## 2019-11-16 ENCOUNTER — Other Ambulatory Visit: Payer: Self-pay

## 2019-11-16 ENCOUNTER — Encounter: Payer: Self-pay | Admitting: Cardiology

## 2019-11-16 ENCOUNTER — Encounter: Payer: Self-pay | Admitting: *Deleted

## 2019-11-16 ENCOUNTER — Ambulatory Visit: Payer: BC Managed Care – PPO | Admitting: Cardiology

## 2019-11-16 VITALS — BP 118/78 | HR 92 | Ht 71.0 in | Wt 239.4 lb

## 2019-11-16 DIAGNOSIS — I2510 Atherosclerotic heart disease of native coronary artery without angina pectoris: Secondary | ICD-10-CM

## 2019-11-16 DIAGNOSIS — I10 Essential (primary) hypertension: Secondary | ICD-10-CM | POA: Diagnosis not present

## 2019-11-16 DIAGNOSIS — E782 Mixed hyperlipidemia: Secondary | ICD-10-CM

## 2019-11-16 NOTE — Patient Instructions (Signed)
Medication Instructions:  Your physician has recommended you make the following change in your medication:   Stop Taking Brilinta   *If you need a refill on your cardiac medications before your next appointment, please call your pharmacy*   Lab Work: NONE   If you have labs (blood work) drawn today and your tests are completely normal, you will receive your results only by: Marland Kitchen MyChart Message (if you have MyChart) OR . A paper copy in the mail If you have any lab test that is abnormal or we need to change your treatment, we will call you to review the results.   Testing/Procedures: NONE    Follow-Up: At [facility], you and your health needs are our priority.  As part of our continuing mission to provide you with exceptional heart care, we have created designated Provider Care Teams.  These Care Teams include your primary Cardiologist (physician) and Advanced Practice Providers (APPs -  Physician Assistants and Nurse Practitioners) who all work together to provide you with the care you need, when you need it.  We recommend signing up for the patient portal called "MyChart".  Sign up information is provided on this After Visit Summary.  MyChart is used to connect with patients for Virtual Visits (Telemedicine).  Patients are able to view lab/test results, encounter notes, upcoming appointments, etc.  Non-urgent messages can be sent to your provider as well.   To learn more about what you can do with MyChart, go to [email or web address].    Your next appointment:   6 month(s)  The format for your next appointment:   In Person  Provider:   Dina Rich, MD   Other Instructions Thank you for choosing [facility]!
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
## 2019-11-21 ENCOUNTER — Other Ambulatory Visit: Payer: Self-pay | Admitting: Cardiology

## 2019-11-22 ENCOUNTER — Telehealth: Payer: Self-pay | Admitting: Cardiology

## 2019-11-22 MED ORDER — LOSARTAN POTASSIUM 50 MG PO TABS: 50.0000 mg | ORAL_TABLET | Freq: Every day | ORAL | 3 refills | Status: DC

## 2019-11-22 NOTE — Telephone Encounter (Signed)
Refill complete 

## 2019-11-22 NOTE — Telephone Encounter (Signed)
New message      *STAT* If patient is at the pharmacy, call can be transferred to refill team.   1. Which medications need to be refilled? (please list name of each medication and dose if known) losartan  2. Which pharmacy/location (including street and city if local pharmacy) is medication to be sent to? [facility] [address]   3. Do they need a 30 day or 90 day supply? 90
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
## 2019-11-27 ENCOUNTER — Ambulatory Visit (INDEPENDENT_AMBULATORY_CARE_PROVIDER_SITE_OTHER): Payer: BC Managed Care – PPO | Admitting: Family Medicine

## 2019-11-27 ENCOUNTER — Encounter: Payer: Self-pay | Admitting: *Deleted

## 2019-11-27 ENCOUNTER — Encounter: Payer: Self-pay | Admitting: Family Medicine

## 2019-11-27 VITALS — BP 148/88 | HR 81 | Ht 71.0 in | Wt 239.2 lb

## 2019-11-27 DIAGNOSIS — M7989 Other specified soft tissue disorders: Secondary | ICD-10-CM | POA: Diagnosis not present

## 2019-11-27 DIAGNOSIS — E785 Hyperlipidemia, unspecified: Secondary | ICD-10-CM | POA: Diagnosis not present

## 2019-11-27 DIAGNOSIS — I10 Essential (primary) hypertension: Secondary | ICD-10-CM

## 2019-11-27 DIAGNOSIS — I2510 Atherosclerotic heart disease of native coronary artery without angina pectoris: Secondary | ICD-10-CM | POA: Diagnosis not present

## 2019-11-27 DIAGNOSIS — M79606 Pain in leg, unspecified: Secondary | ICD-10-CM

## 2019-11-27 MED ORDER — ATORVASTATIN CALCIUM 80 MG PO TABS: 80.0000 mg | ORAL_TABLET | Freq: Every day | ORAL | 3 refills | Status: AC

## 2019-11-27 NOTE — Patient Instructions (Addendum)
Medication Instructions:   Lipitor refilled today.   Continue all other current medications.  Labwork: none  Testing/Procedures:  Your physician has requested that you have an ankle brachial index (ABI). During this test an ultrasound and blood pressure cuff are used to evaluate the arteries that supply the arms and legs with blood. Allow thirty minutes for this exam. There are no restrictions or special instructions.  Your physician has requested that you have a lower extremity arterial duplex. During this test, ultrasound is used to evaluate arterial blood flow in the legs. Allow one hour for this exam. There are no restrictions or special instructions.  Office will contact with results via phone or letter.    Follow-Up: 6 months   Any Other Special Instructions Will Be Listed Below (If Applicable).  If you need a refill on your cardiac medications before your next appointment, please call your pharmacy.

## 2019-11-28 ENCOUNTER — Other Ambulatory Visit: Payer: Self-pay | Admitting: Family Medicine

## 2019-11-28 DIAGNOSIS — I739 Peripheral vascular disease, unspecified: Secondary | ICD-10-CM

## 2020-03-21 DIAGNOSIS — I10 Essential (primary) hypertension: Secondary | ICD-10-CM | POA: Diagnosis not present

## 2020-03-21 DIAGNOSIS — E119 Type 2 diabetes mellitus without complications: Secondary | ICD-10-CM | POA: Diagnosis not present

## 2020-03-21 DIAGNOSIS — Z1331 Encounter for screening for depression: Secondary | ICD-10-CM | POA: Diagnosis not present

## 2020-03-21 DIAGNOSIS — E039 Hypothyroidism, unspecified: Secondary | ICD-10-CM | POA: Diagnosis not present

## 2020-03-21 DIAGNOSIS — E559 Vitamin D deficiency, unspecified: Secondary | ICD-10-CM | POA: Diagnosis not present

## 2020-03-21 DIAGNOSIS — Z20822 Contact with and (suspected) exposure to covid-19: Secondary | ICD-10-CM | POA: Diagnosis not present

## 2020-03-21 DIAGNOSIS — Z1339 Encounter for screening examination for other mental health and behavioral disorders: Secondary | ICD-10-CM | POA: Diagnosis not present

## 2020-03-21 DIAGNOSIS — Z8042 Family history of malignant neoplasm of prostate: Secondary | ICD-10-CM | POA: Diagnosis not present

## 2020-03-21 DIAGNOSIS — E7800 Pure hypercholesterolemia, unspecified: Secondary | ICD-10-CM | POA: Diagnosis not present

## 2020-03-21 DIAGNOSIS — E063 Autoimmune thyroiditis: Secondary | ICD-10-CM | POA: Diagnosis not present

## 2020-03-21 DIAGNOSIS — Z0000 Encounter for general adult medical examination without abnormal findings: Secondary | ICD-10-CM | POA: Diagnosis not present

## 2020-03-21 DIAGNOSIS — Z6834 Body mass index (BMI) 34.0-34.9, adult: Secondary | ICD-10-CM | POA: Diagnosis not present

## 2020-03-21 DIAGNOSIS — Z13228 Encounter for screening for other metabolic disorders: Secondary | ICD-10-CM | POA: Diagnosis not present

## 2020-03-21 DIAGNOSIS — Z136 Encounter for screening for cardiovascular disorders: Secondary | ICD-10-CM | POA: Diagnosis not present

## 2020-03-21 DIAGNOSIS — R0602 Shortness of breath: Secondary | ICD-10-CM | POA: Diagnosis not present

## 2020-03-21 DIAGNOSIS — E038 Other specified hypothyroidism: Secondary | ICD-10-CM | POA: Diagnosis not present

## 2020-03-21 DIAGNOSIS — I2121 ST elevation (STEMI) myocardial infarction involving left circumflex coronary artery: Secondary | ICD-10-CM | POA: Diagnosis not present

## 2020-10-20 IMAGING — CR DG CHEST 2V
3 series · 3 of 3 positions shown · non-contrast
Comparison: 02/10/2017

CLINICAL DATA: Fever.

EXAM:
CHEST - 2 VIEW

[chest lat (1 of 2)]
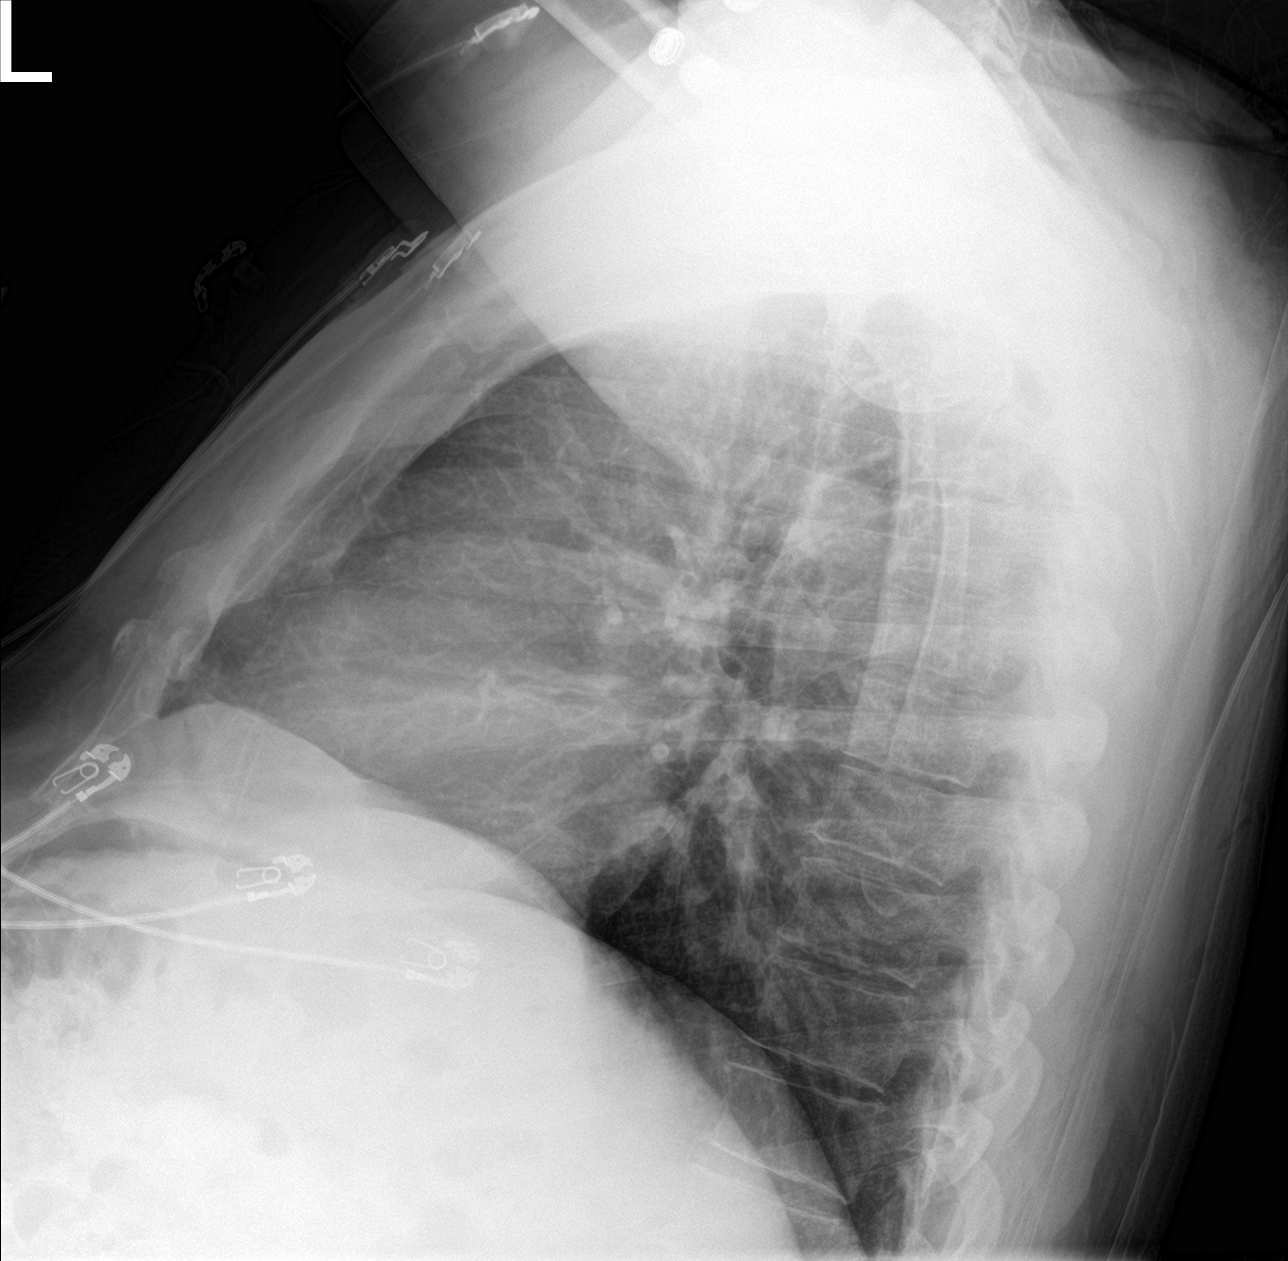

[chest ap]
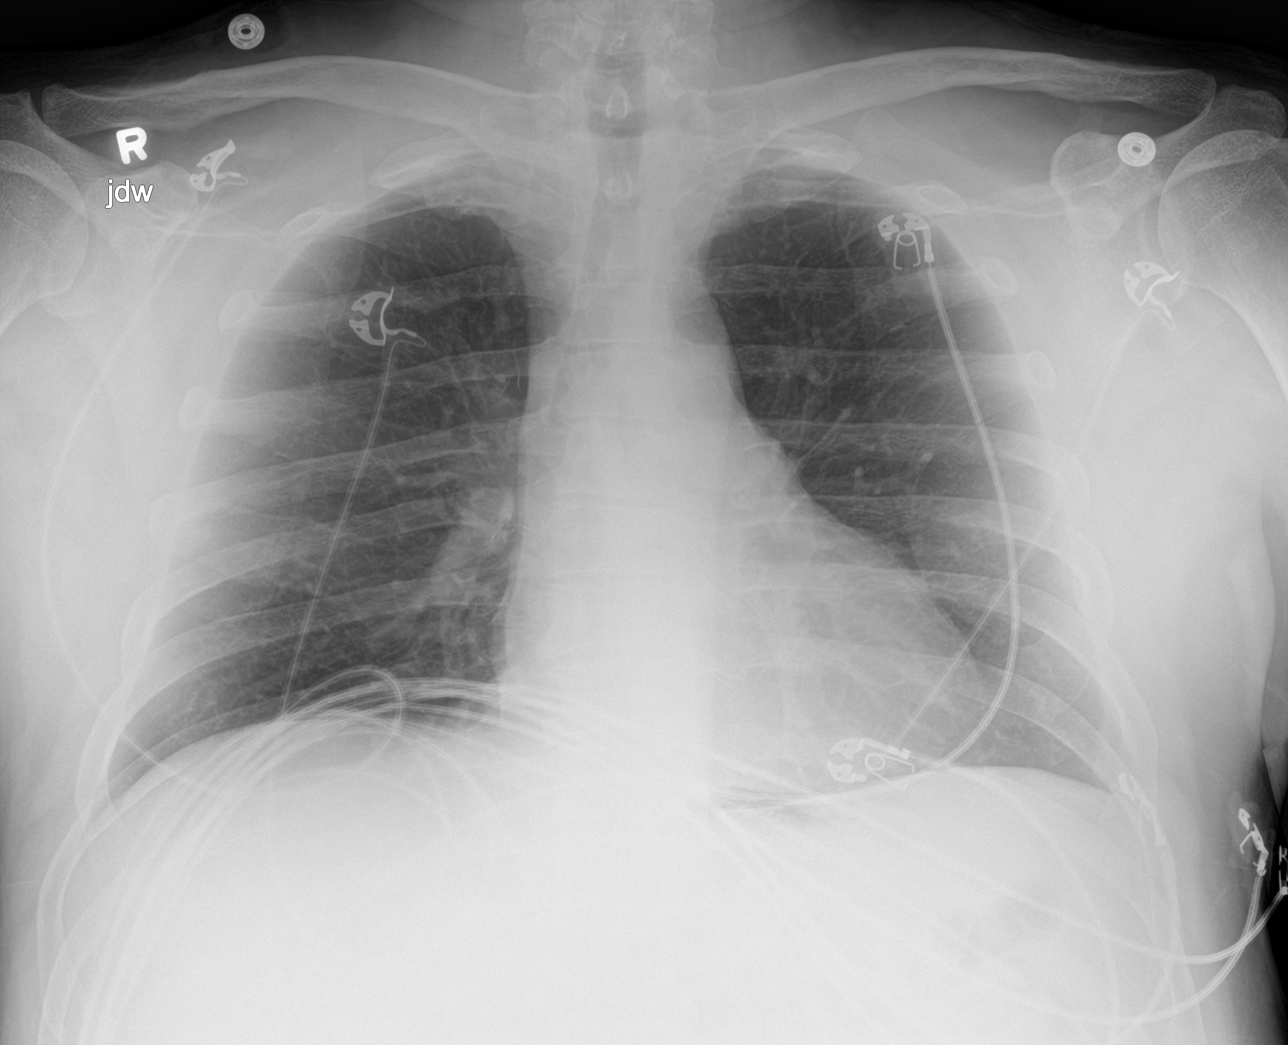

[chest lat (2 of 2)]
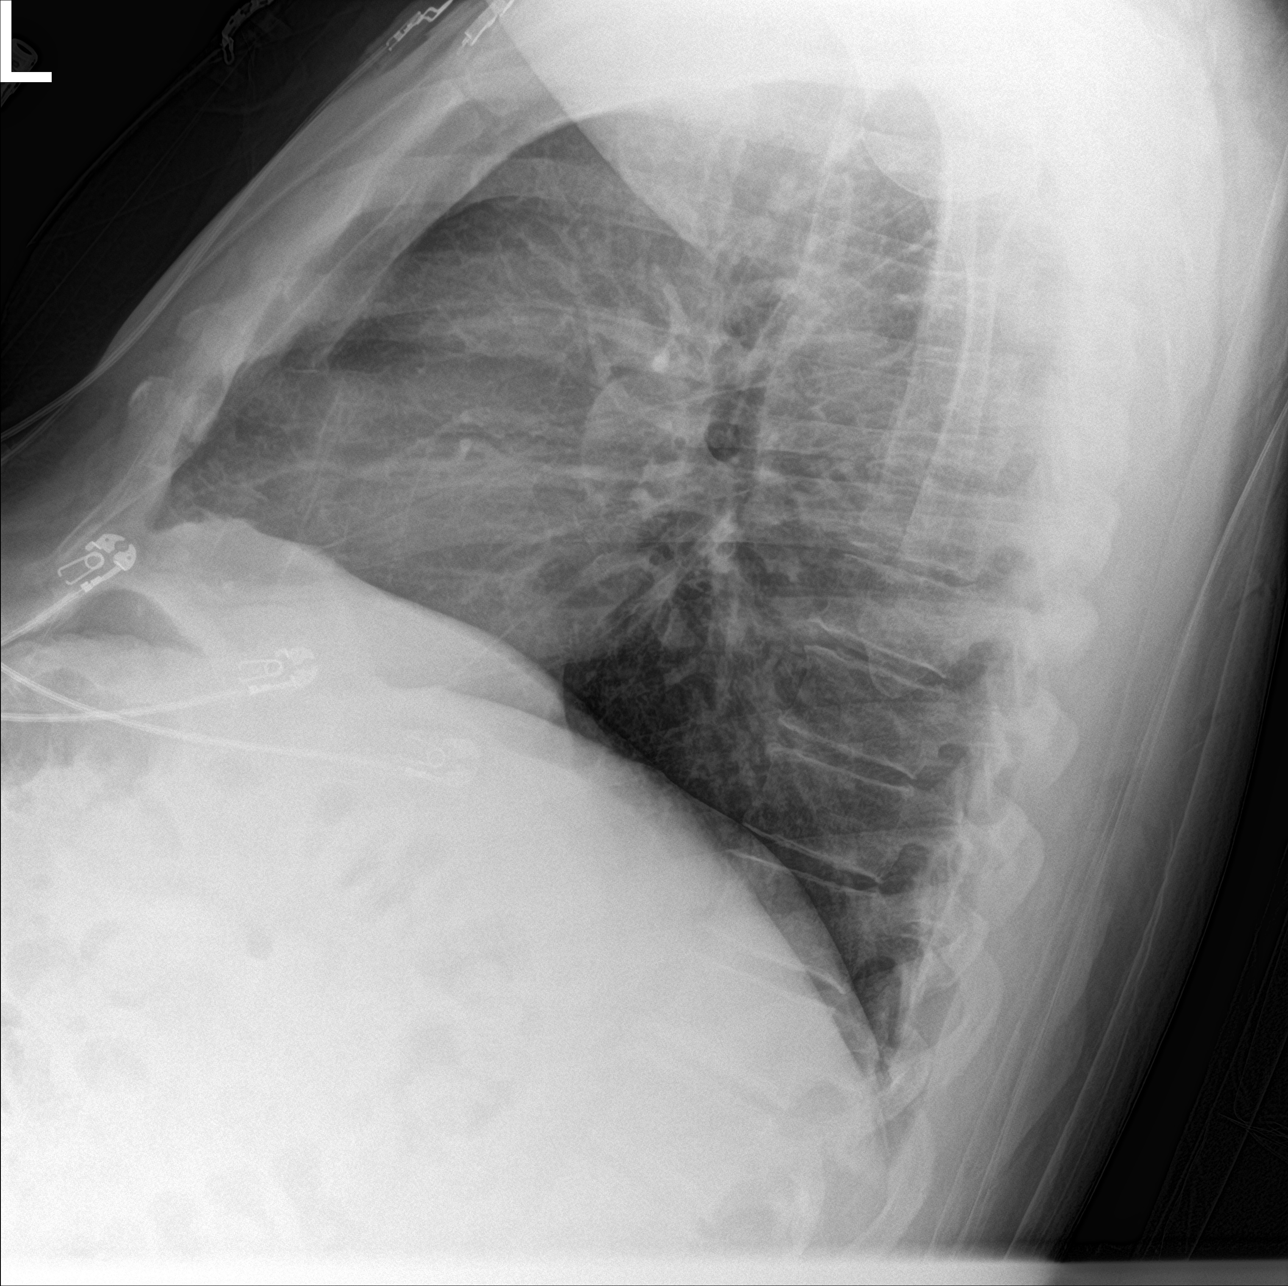

[3 of 3 positions shown; findings below may reference images not displayed]

FINDINGS: The cardiomediastinal contours are normal. The lungs are clear.
Pulmonary vasculature is normal. No consolidation, pleural effusion,
or pneumothorax. No acute osseous abnormalities are seen.
IMPRESSION: No acute chest findings.

## 2020-10-21 ENCOUNTER — Other Ambulatory Visit: Payer: Self-pay | Admitting: Physician Assistant

## 2020-12-18 ENCOUNTER — Other Ambulatory Visit: Payer: Self-pay | Admitting: Cardiology

## 2021-02-18 ENCOUNTER — Other Ambulatory Visit: Payer: Self-pay | Admitting: Cardiology

## 2021-03-19 ENCOUNTER — Other Ambulatory Visit: Payer: Self-pay | Admitting: Cardiology

## 2021-04-04 ENCOUNTER — Other Ambulatory Visit: Payer: Self-pay | Admitting: Cardiology

## 2021-06-22 ENCOUNTER — Other Ambulatory Visit: Payer: Self-pay | Admitting: Cardiology
